# Patient Record
Sex: Female | Born: 1940 | Race: Black or African American | Hispanic: No | Smoking: Never smoker
Health system: Southern US, Community
[De-identification: ages and names within clinical notes are randomized; demographics above are authoritative.]

## PROBLEM LIST (undated history)

## (undated) DIAGNOSIS — M1990 Unspecified osteoarthritis, unspecified site: Secondary | ICD-10-CM

## (undated) DIAGNOSIS — I10 Essential (primary) hypertension: Secondary | ICD-10-CM

## (undated) DIAGNOSIS — E119 Type 2 diabetes mellitus without complications: Secondary | ICD-10-CM

## (undated) DIAGNOSIS — E785 Hyperlipidemia, unspecified: Secondary | ICD-10-CM

## (undated) DIAGNOSIS — I509 Heart failure, unspecified: Secondary | ICD-10-CM

---

## 2004-04-27 ENCOUNTER — Emergency Department: Payer: Self-pay | Admitting: Internal Medicine

## 2007-08-18 ENCOUNTER — Emergency Department: Payer: Self-pay | Admitting: Emergency Medicine

## 2011-09-20 ENCOUNTER — Emergency Department: Payer: Self-pay | Admitting: Emergency Medicine

## 2011-09-28 ENCOUNTER — Emergency Department: Payer: Self-pay | Admitting: *Deleted

## 2014-12-30 ENCOUNTER — Encounter: Payer: Self-pay | Admitting: Emergency Medicine

## 2014-12-30 ENCOUNTER — Emergency Department: Payer: No Typology Code available for payment source

## 2014-12-30 ENCOUNTER — Emergency Department
Admission: EM | Admit: 2014-12-30 | Discharge: 2014-12-30 | Disposition: A | Discharge status: Home / Self Care | Payer: No Typology Code available for payment source | Attending: Student | Admitting: Student

## 2014-12-30 DIAGNOSIS — Y92410 Unspecified street and highway as the place of occurrence of the external cause: Secondary | ICD-10-CM | POA: Diagnosis not present

## 2014-12-30 DIAGNOSIS — Y998 Other external cause status: Secondary | ICD-10-CM | POA: Diagnosis not present

## 2014-12-30 DIAGNOSIS — S20211A Contusion of right front wall of thorax, initial encounter: Secondary | ICD-10-CM | POA: Diagnosis not present

## 2014-12-30 DIAGNOSIS — S1081XA Abrasion of other specified part of neck, initial encounter: Secondary | ICD-10-CM | POA: Diagnosis not present

## 2014-12-30 DIAGNOSIS — E119 Type 2 diabetes mellitus without complications: Secondary | ICD-10-CM | POA: Insufficient documentation

## 2014-12-30 DIAGNOSIS — S299XXA Unspecified injury of thorax, initial encounter: Secondary | ICD-10-CM | POA: Diagnosis present

## 2014-12-30 DIAGNOSIS — Y9389 Activity, other specified: Secondary | ICD-10-CM | POA: Insufficient documentation

## 2014-12-30 DIAGNOSIS — I10 Essential (primary) hypertension: Secondary | ICD-10-CM | POA: Insufficient documentation

## 2014-12-30 DIAGNOSIS — S1091XA Abrasion of unspecified part of neck, initial encounter: Secondary | ICD-10-CM

## 2014-12-30 HISTORY — DX: Heart failure, unspecified: I50.9

## 2014-12-30 HISTORY — DX: Unspecified osteoarthritis, unspecified site: M19.90

## 2014-12-30 HISTORY — DX: Type 2 diabetes mellitus without complications: E11.9

## 2014-12-30 HISTORY — DX: Essential (primary) hypertension: I10

## 2014-12-30 HISTORY — DX: Hyperlipidemia, unspecified: E78.5

## 2014-12-30 MED ORDER — MORPHINE SULFATE (PF) 4 MG/ML IV SOLN
4.0000 mg | Freq: Once | INTRAVENOUS | Status: AC
  Administered 2014-12-30: 4 mg via INTRAVENOUS
  Filled 2014-12-30: qty 1

## 2014-12-30 MED ORDER — ONDANSETRON HCL 4 MG/2ML IJ SOLN
4.0000 mg | Freq: Once | INTRAMUSCULAR | Status: AC
  Administered 2014-12-30: 4 mg via INTRAVENOUS
  Filled 2014-12-30: qty 2

## 2014-12-30 MED ORDER — OXYCODONE HCL 5 MG PO TABS: 5.0000 mg | ORAL_TABLET | Freq: Four times a day (QID) | ORAL | Status: DC | PRN

## 2014-12-30 NOTE — ED Notes (Signed)
Pt given meal tray at this time, sitting up in bed eating, tolerating well, no acute distress noted.
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
## 2014-12-30 NOTE — ED Notes (Signed)
Patient transported to X-ray 

## 2014-12-30 NOTE — Discharge Instructions (Signed)
Blunt Chest Trauma °Blunt chest trauma is an injury caused by a blow to the chest. These chest injuries can be very painful. Blunt chest trauma often results in bruised or broken (fractured) ribs. Most cases of bruised and fractured ribs from blunt chest traumas get better after 1 to 3 weeks of rest and pain medicine. Often, the soft tissue in the chest wall is also injured, causing pain and bruising. Internal organs, such as the heart and lungs, may also be injured. Blunt chest trauma can lead to serious medical problems. This injury requires immediate medical care. °CAUSES  °· Motor vehicle collisions. °· Falls. °· Physical violence. °· Sports injuries. °SYMPTOMS  °· Chest pain. The pain may be worse when you move or breathe deeply. °· Shortness of breath. °· Lightheadedness. °· Bruising. °· Tenderness. °· Swelling. °DIAGNOSIS  °Your caregiver will do a physical exam. X-rays may be taken to look for fractures. However, minor rib fractures may not show up on X-rays until a few days after the injury. If a more serious injury is suspected, further imaging tests may be done. This may include ultrasounds, computed tomography (CT) scans, or magnetic resonance imaging (MRI). °TREATMENT  °Treatment depends on the severity of your injury. Your caregiver may prescribe pain medicines and deep breathing exercises. °HOME CARE INSTRUCTIONS °· Limit your activities until you can move around without much pain. °· Do not do any strenuous work until your injury is healed. °· Put ice on the injured area. °¨ Put ice in a plastic bag. °¨ Place a towel between your skin and the bag. °¨ Leave the ice on for 15-20 minutes, 03-04 times a day. °· You may wear a rib belt as directed by your caregiver to reduce pain. °· Practice deep breathing as directed by your caregiver to keep your lungs clear. °· Only take over-the-counter or prescription medicines for pain, fever, or discomfort as directed by your caregiver. °SEEK IMMEDIATE MEDICAL  CARE IF:  °· You have increasing pain or shortness of breath. °· You cough up blood. °· You have nausea, vomiting, or abdominal pain. °· You have a fever. °· You feel dizzy, weak, or you faint. °MAKE SURE YOU: °· Understand these instructions. °· Will watch your condition. °· Will get help right away if you are not doing well or get worse. °  °This information is not intended to replace advice given to you by your health care provider. Make sure you discuss any questions you have with your health care provider. °  °Document Released: 03/27/2004 Document Revised: 03/10/2014 Document Reviewed: 08/16/2014 °Elsevier Interactive Patient Education ©2016 [employer] ° °

## 2014-12-30 NOTE — ED Notes (Signed)
MD Gayle at bedside. 

## 2015-01-03 ENCOUNTER — Emergency Department
Admission: EM | Admit: 2015-01-03 | Discharge: 2015-01-03 | Disposition: A | Discharge status: Home / Self Care | Payer: Medicare Other | Attending: Emergency Medicine | Admitting: Emergency Medicine

## 2015-01-03 DIAGNOSIS — Z880 Allergy status to penicillin: Secondary | ICD-10-CM | POA: Diagnosis not present

## 2015-01-03 DIAGNOSIS — E119 Type 2 diabetes mellitus without complications: Secondary | ICD-10-CM | POA: Diagnosis not present

## 2015-01-03 DIAGNOSIS — S161XXD Strain of muscle, fascia and tendon at neck level, subsequent encounter: Secondary | ICD-10-CM | POA: Diagnosis not present

## 2015-01-03 DIAGNOSIS — G44209 Tension-type headache, unspecified, not intractable: Secondary | ICD-10-CM | POA: Diagnosis not present

## 2015-01-03 DIAGNOSIS — I10 Essential (primary) hypertension: Secondary | ICD-10-CM | POA: Diagnosis not present

## 2015-01-03 DIAGNOSIS — G44309 Post-traumatic headache, unspecified, not intractable: Secondary | ICD-10-CM | POA: Diagnosis present

## 2015-01-03 MED ORDER — TRAMADOL HCL 50 MG PO TABS
50.0000 mg | ORAL_TABLET | Freq: Once | ORAL | Status: AC
  Administered 2015-01-03: 50 mg via ORAL
  Filled 2015-01-03: qty 1

## 2015-01-03 MED ORDER — DIAZEPAM 2 MG PO TABS
2.0000 mg | ORAL_TABLET | Freq: Once | ORAL | Status: AC
  Administered 2015-01-03: 2 mg via ORAL
  Filled 2015-01-03: qty 1

## 2015-01-03 MED ORDER — TRAMADOL HCL 50 MG PO TABS: 50.0000 mg | ORAL_TABLET | Freq: Four times a day (QID) | ORAL | Status: DC | PRN

## 2015-01-03 MED ORDER — MELOXICAM 7.5 MG PO TABS: 7.5000 mg | ORAL_TABLET | Freq: Every day | ORAL | Status: AC
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
## 2015-01-03 NOTE — Discharge Instructions (Signed)
Tension Headache A tension headache is pain, pressure, or aching that is felt over the front and sides of your head. These headaches can last from 30 minutes to several days. HOME CARE Managing Pain  Take over-the-counter and prescription medicines only as told by your doctor.  Lie down in a dark, quiet room when you have a headache.  If directed, apply ice to your head and neck area:  Put ice in a plastic bag.  Place a towel between your skin and the bag.  Leave the ice on for 20 minutes, 2-3 times per day.  Use a heating pad or a hot shower to apply heat to your head and neck area as told by your doctor. Eating and Drinking  Eat meals on a regular schedule.  Do not drink a lot of alcohol.  Do not use a lot of caffeine, or stop using caffeine. General Instructions  Keep all follow-up visits as told by your doctor. This is important.  Keep a journal to find out if certain things bring on headaches. For example, write down:  What you eat and drink.  How much sleep you get.  Any change to your diet or medicines.  Try getting a massage, or doing other things that help you to relax.  Lessen stress.  Sit up straight. Do not tighten (tense) your muscles.  Do not use tobacco products. This includes cigarettes, chewing tobacco, or e-cigarettes. If you need help quitting, ask your doctor.  Exercise regularly as told by your doctor.  Get enough sleep. This may mean 7-9 hours of sleep. GET HELP IF:  Your symptoms are not helped by medicine.  You have a headache that feels different from your usual headache.  You feel sick to your stomach (nauseous) or you throw up (vomit).  You have a fever. GET HELP RIGHT AWAY IF:  Your headache becomes very bad.  You keep throwing up.  You have a stiff neck.  You have trouble seeing.  You have trouble speaking.  You have pain in your eye or ear.  Your muscles are weak or you lose muscle control.  You lose your balance  or you have trouble walking.  You feel like you will pass out (faint) or you pass out.  You have confusion.   This information is not intended to replace advice given to you by your health care provider. Make sure you discuss any questions you have with your health care provider.   Document Released: 05/14/2009 Document Revised: 11/08/2014 Document Reviewed: 06/12/2014 Elsevier Interactive Patient Education 2016 [place] with your doctor if any continued problems. Use moist compresses heat or ice to her neck muscles. Take tramadol as needed for headache. Begin meloxicam 1 daily with food as needed for your arthritis The aware that the pain medication increases drowsiness which may increase her risk of falling.
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
## 2017-06-06 ENCOUNTER — Other Ambulatory Visit: Payer: Self-pay

## 2017-06-06 ENCOUNTER — Emergency Department: Payer: Medicare Other

## 2017-06-06 ENCOUNTER — Encounter: Payer: Self-pay | Admitting: Emergency Medicine

## 2017-06-06 ENCOUNTER — Emergency Department
Admission: EM | Admit: 2017-06-06 | Discharge: 2017-06-06 | Disposition: A | Discharge status: Home / Self Care | Payer: Medicare Other | Attending: Emergency Medicine | Admitting: Emergency Medicine

## 2017-06-06 DIAGNOSIS — I509 Heart failure, unspecified: Secondary | ICD-10-CM | POA: Insufficient documentation

## 2017-06-06 DIAGNOSIS — M542 Cervicalgia: Secondary | ICD-10-CM | POA: Diagnosis not present

## 2017-06-06 DIAGNOSIS — M545 Low back pain, unspecified: Secondary | ICD-10-CM

## 2017-06-06 DIAGNOSIS — E119 Type 2 diabetes mellitus without complications: Secondary | ICD-10-CM | POA: Diagnosis not present

## 2017-06-06 DIAGNOSIS — I110 Hypertensive heart disease with heart failure: Secondary | ICD-10-CM | POA: Diagnosis not present

## 2017-06-06 MED ORDER — ACETAMINOPHEN 325 MG PO TABS
650.0000 mg | ORAL_TABLET | Freq: Once | ORAL | Status: AC
  Administered 2017-06-06: 650 mg via ORAL
  Filled 2017-06-06: qty 2

## 2017-06-06 MED ORDER — TRAMADOL HCL 50 MG PO TABS: 50.0000 mg | ORAL_TABLET | Freq: Two times a day (BID) | ORAL | 0 refills | Status: DC

## 2017-06-06 NOTE — ED Notes (Signed)
Patient transported to XR. 

## 2017-06-06 NOTE — ED Notes (Signed)
[employer] officer at bedside 
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
## 2019-04-30 ENCOUNTER — Encounter: Payer: Self-pay | Admitting: Emergency Medicine

## 2019-04-30 ENCOUNTER — Emergency Department: Payer: Medicare Other

## 2019-04-30 ENCOUNTER — Encounter
Admission: EM | Disposition: A | Discharge status: Home / Self Care | Payer: Self-pay | Source: Home / Self Care | Attending: Surgery

## 2019-04-30 ENCOUNTER — Ambulatory Visit: Admit: 2019-04-30 | Payer: No Typology Code available for payment source | Admitting: Surgery

## 2019-04-30 ENCOUNTER — Other Ambulatory Visit: Payer: Self-pay

## 2019-04-30 ENCOUNTER — Inpatient Hospital Stay
Admission: EM | Admit: 2019-04-30 | Discharge: 2019-05-04 | DRG: 329 | Disposition: A | Discharge status: Home Health | Payer: Medicare Other | Attending: Surgery | Admitting: Surgery

## 2019-04-30 ENCOUNTER — Emergency Department: Payer: Medicare Other | Admitting: Certified Registered"

## 2019-04-30 DIAGNOSIS — Z79899 Other long term (current) drug therapy: Secondary | ICD-10-CM

## 2019-04-30 DIAGNOSIS — Z20822 Contact with and (suspected) exposure to covid-19: Secondary | ICD-10-CM | POA: Diagnosis present

## 2019-04-30 DIAGNOSIS — K460 Unspecified abdominal hernia with obstruction, without gangrene: Secondary | ICD-10-CM | POA: Diagnosis not present

## 2019-04-30 DIAGNOSIS — I509 Heart failure, unspecified: Secondary | ICD-10-CM | POA: Diagnosis present

## 2019-04-30 DIAGNOSIS — E119 Type 2 diabetes mellitus without complications: Secondary | ICD-10-CM | POA: Diagnosis present

## 2019-04-30 DIAGNOSIS — K436 Other and unspecified ventral hernia with obstruction, without gangrene: Principal | ICD-10-CM | POA: Diagnosis present

## 2019-04-30 DIAGNOSIS — E876 Hypokalemia: Secondary | ICD-10-CM | POA: Diagnosis present

## 2019-04-30 DIAGNOSIS — K559 Vascular disorder of intestine, unspecified: Secondary | ICD-10-CM | POA: Diagnosis present

## 2019-04-30 DIAGNOSIS — K55029 Acute infarction of small intestine, extent unspecified: Secondary | ICD-10-CM | POA: Diagnosis present

## 2019-04-30 DIAGNOSIS — E785 Hyperlipidemia, unspecified: Secondary | ICD-10-CM | POA: Diagnosis present

## 2019-04-30 DIAGNOSIS — R112 Nausea with vomiting, unspecified: Secondary | ICD-10-CM

## 2019-04-30 DIAGNOSIS — Z7982 Long term (current) use of aspirin: Secondary | ICD-10-CM

## 2019-04-30 DIAGNOSIS — M1990 Unspecified osteoarthritis, unspecified site: Secondary | ICD-10-CM | POA: Diagnosis present

## 2019-04-30 DIAGNOSIS — K567 Ileus, unspecified: Secondary | ICD-10-CM | POA: Diagnosis not present

## 2019-04-30 DIAGNOSIS — I110 Hypertensive heart disease with heart failure: Secondary | ICD-10-CM | POA: Diagnosis present

## 2019-04-30 DIAGNOSIS — Z79891 Long term (current) use of opiate analgesic: Secondary | ICD-10-CM | POA: Diagnosis not present

## 2019-04-30 DIAGNOSIS — K56609 Unspecified intestinal obstruction, unspecified as to partial versus complete obstruction: Secondary | ICD-10-CM

## 2019-04-30 HISTORY — PX: VENTRAL HERNIA REPAIR: SHX424

## 2019-04-30 LAB — COMPREHENSIVE METABOLIC PANEL
ALT: 10 U/L (ref 0–44)
AST: 18 U/L (ref 15–41)
Albumin: 3.5 g/dL (ref 3.5–5.0)
Alkaline Phosphatase: 69 U/L (ref 38–126)
Anion gap: 12 (ref 5–15)
BUN: 20 mg/dL (ref 8–23)
CO2: 23 mmol/L (ref 22–32)
Calcium: 9.4 mg/dL (ref 8.9–10.3)
Chloride: 99 mmol/L (ref 98–111)
Creatinine, Ser: 1.08 mg/dL — ABNORMAL HIGH (ref 0.44–1.00)
GFR calc Af Amer: 57 mL/min — ABNORMAL LOW (ref >60)
GFR calc non Af Amer: 49 mL/min — ABNORMAL LOW (ref >60)
Glucose, Bld: 290 mg/dL — ABNORMAL HIGH (ref 70–99)
Potassium: 3.1 mmol/L — ABNORMAL LOW (ref 3.5–5.1)
Sodium: 134 mmol/L — ABNORMAL LOW (ref 135–145)
Total Bilirubin: 1.5 mg/dL — ABNORMAL HIGH (ref 0.3–1.2)
Total Protein: 8.4 g/dL — ABNORMAL HIGH (ref 6.5–8.1)

## 2019-04-30 LAB — CBC
HCT: 41.2 % (ref 36.0–46.0)
Hemoglobin: 13.5 g/dL (ref 12.0–15.0)
MCH: 29.3 pg (ref 26.0–34.0)
MCHC: 32.8 g/dL (ref 30.0–36.0)
MCV: 89.6 fL (ref 80.0–100.0)
Platelets: 462 10*3/uL — ABNORMAL HIGH (ref 150–400)
RBC: 4.6 MIL/uL (ref 3.87–5.11)
RDW: 13.8 % (ref 11.5–15.5)
WBC: 11.7 10*3/uL — ABNORMAL HIGH (ref 4.0–10.5)
nRBC: 0 % (ref 0.0–0.2)

## 2019-04-30 LAB — RESPIRATORY PANEL BY RT PCR (FLU A&B, COVID)
Influenza A by PCR: NEGATIVE
Influenza B by PCR: NEGATIVE
SARS Coronavirus 2 by RT PCR: NEGATIVE

## 2019-04-30 LAB — LIPASE, BLOOD: Lipase: 36 U/L (ref 11–51)

## 2019-04-30 LAB — GLUCOSE, CAPILLARY: Glucose-Capillary: 171 mg/dL — ABNORMAL HIGH (ref 70–99)

## 2019-04-30 SURGERY — REPAIR, HERNIA, VENTRAL
Anesthesia: General | Site: Abdomen

## 2019-04-30 MED ORDER — INSULIN ASPART 100 UNIT/ML ~~LOC~~ SOLN: 0.0000 [IU] | Freq: Every day | SUBCUTANEOUS | Status: DC

## 2019-04-30 MED ORDER — MORPHINE SULFATE (PF) 4 MG/ML IV SOLN
4.0000 mg | Freq: Once | INTRAVENOUS | Status: AC
  Administered 2019-04-30: 4 mg via INTRAVENOUS
  Filled 2019-04-30: qty 1

## 2019-04-30 MED ORDER — ENOXAPARIN SODIUM 40 MG/0.4ML ~~LOC~~ SOLN
40.0000 mg | SUBCUTANEOUS | Status: DC
  Administered 2019-05-02 – 2019-05-04 (×3): 40 mg via SUBCUTANEOUS
  Filled 2019-04-30 (×4): qty 0.4

## 2019-04-30 MED ORDER — OXYCODONE HCL 5 MG PO TABS
5.0000 mg | ORAL_TABLET | ORAL | Status: DC | PRN
  Administered 2019-05-03: 5 mg via ORAL
  Filled 2019-04-30: qty 1

## 2019-04-30 MED ORDER — MORPHINE SULFATE (PF) 2 MG/ML IV SOLN
2.0000 mg | INTRAVENOUS | Status: DC | PRN
  Administered 2019-05-01: 2 mg via INTRAVENOUS
  Filled 2019-04-30: qty 1

## 2019-04-30 MED ORDER — ONDANSETRON HCL 4 MG/2ML IJ SOLN
INTRAMUSCULAR | Status: DC | PRN
  Administered 2019-04-30: 4 mg via INTRAVENOUS

## 2019-04-30 MED ORDER — ONDANSETRON HCL 4 MG/2ML IJ SOLN
4.0000 mg | Freq: Four times a day (QID) | INTRAMUSCULAR | Status: DC | PRN
  Administered 2019-05-01 – 2019-05-02 (×4): 4 mg via INTRAVENOUS
  Filled 2019-04-30 (×4): qty 2

## 2019-04-30 MED ORDER — PROCHLORPERAZINE MALEATE 10 MG PO TABS
10.0000 mg | ORAL_TABLET | Freq: Four times a day (QID) | ORAL | Status: DC | PRN
  Filled 2019-04-30: qty 1

## 2019-04-30 MED ORDER — SUGAMMADEX SODIUM 200 MG/2ML IV SOLN
INTRAVENOUS | Status: DC | PRN
  Administered 2019-04-30: 150 mg via INTRAVENOUS

## 2019-04-30 MED ORDER — FENTANYL CITRATE (PF) 100 MCG/2ML IJ SOLN: 25.0000 ug | INTRAMUSCULAR | Status: DC | PRN

## 2019-04-30 MED ORDER — ACETAMINOPHEN 500 MG PO TABS
1000.0000 mg | ORAL_TABLET | Freq: Four times a day (QID) | ORAL | Status: DC
  Administered 2019-05-01 – 2019-05-03 (×7): 1000 mg via ORAL
  Administered 2019-05-04: 500 mg via ORAL
  Filled 2019-04-30 (×9): qty 2

## 2019-04-30 MED ORDER — BUPIVACAINE LIPOSOME 1.3 % IJ SUSP
INTRAMUSCULAR | Status: DC | PRN
  Administered 2019-04-30: 20 mL

## 2019-04-30 MED ORDER — PANTOPRAZOLE SODIUM 40 MG IV SOLR
40.0000 mg | Freq: Every day | INTRAVENOUS | Status: DC
  Administered 2019-04-30 – 2019-05-03 (×4): 40 mg via INTRAVENOUS
  Filled 2019-04-30 (×4): qty 40

## 2019-04-30 MED ORDER — DEXAMETHASONE SODIUM PHOSPHATE 10 MG/ML IJ SOLN
INTRAMUSCULAR | Status: DC | PRN
  Administered 2019-04-30: 10 mg via INTRAVENOUS

## 2019-04-30 MED ORDER — SODIUM CHLORIDE 0.9 % IV BOLUS: 1000.0000 mL | Freq: Once | INTRAVENOUS | Status: DC

## 2019-04-30 MED ORDER — SODIUM CHLORIDE 0.9% FLUSH: 3.0000 mL | Freq: Once | INTRAVENOUS | Status: DC

## 2019-04-30 MED ORDER — SODIUM CHLORIDE 0.9 % IV SOLN
2.0000 g | Freq: Two times a day (BID) | INTRAVENOUS | Status: AC
  Administered 2019-04-30 – 2019-05-01 (×2): 2 g via INTRAVENOUS
  Filled 2019-04-30 (×2): qty 2

## 2019-04-30 MED ORDER — DIPHENHYDRAMINE HCL 50 MG/ML IJ SOLN: 12.5000 mg | Freq: Four times a day (QID) | INTRAMUSCULAR | Status: DC | PRN

## 2019-04-30 MED ORDER — BUPIVACAINE LIPOSOME 1.3 % IJ SUSP
INTRAMUSCULAR | Status: AC
  Filled 2019-04-30: qty 20

## 2019-04-30 MED ORDER — LACTATED RINGERS IV SOLN: INTRAVENOUS | Status: DC | PRN

## 2019-04-30 MED ORDER — PROPOFOL 10 MG/ML IV BOLUS
INTRAVENOUS | Status: DC | PRN
  Administered 2019-04-30: 90 mg via INTRAVENOUS

## 2019-04-30 MED ORDER — FENTANYL CITRATE (PF) 100 MCG/2ML IJ SOLN
INTRAMUSCULAR | Status: DC | PRN
  Administered 2019-04-30: 25 ug via INTRAVENOUS
  Administered 2019-04-30: 50 ug via INTRAVENOUS

## 2019-04-30 MED ORDER — ROCURONIUM BROMIDE 100 MG/10ML IV SOLN
INTRAVENOUS | Status: DC | PRN
  Administered 2019-04-30: 5 mg via INTRAVENOUS
  Administered 2019-04-30: 25 mg via INTRAVENOUS
  Administered 2019-04-30: 20 mg via INTRAVENOUS

## 2019-04-30 MED ORDER — INSULIN ASPART 100 UNIT/ML ~~LOC~~ SOLN
4.0000 [IU] | Freq: Three times a day (TID) | SUBCUTANEOUS | Status: DC
  Administered 2019-05-03 – 2019-05-04 (×2): 4 [IU] via SUBCUTANEOUS
  Filled 2019-04-30 (×4): qty 1

## 2019-04-30 MED ORDER — FLUTICASONE PROPIONATE 50 MCG/ACT NA SUSP
1.0000 | Freq: Every day | NASAL | Status: DC
  Administered 2019-05-01 – 2019-05-04 (×3): 1 via NASAL
  Filled 2019-04-30: qty 16

## 2019-04-30 MED ORDER — SODIUM CHLORIDE 0.9 % IV SOLN
2.0000 g | Freq: Two times a day (BID) | INTRAVENOUS | Status: DC
  Administered 2019-04-30: 18:00:00 2 g via INTRAVENOUS
  Filled 2019-04-30 (×3): qty 2

## 2019-04-30 MED ORDER — PROCHLORPERAZINE EDISYLATE 10 MG/2ML IJ SOLN
5.0000 mg | Freq: Four times a day (QID) | INTRAMUSCULAR | Status: DC | PRN
  Administered 2019-05-01 – 2019-05-02 (×2): 5 mg via INTRAVENOUS
  Filled 2019-04-30 (×2): qty 2

## 2019-04-30 MED ORDER — ONDANSETRON HCL 4 MG/2ML IJ SOLN: 4.0000 mg | Freq: Once | INTRAMUSCULAR | Status: DC | PRN

## 2019-04-30 MED ORDER — ONDANSETRON 4 MG PO TBDP: 4.0000 mg | ORAL_TABLET | Freq: Four times a day (QID) | ORAL | Status: DC | PRN

## 2019-04-30 MED ORDER — LIDOCAINE HCL (CARDIAC) PF 100 MG/5ML IV SOSY
PREFILLED_SYRINGE | INTRAVENOUS | Status: DC | PRN
  Administered 2019-04-30: 60 mg via INTRAVENOUS

## 2019-04-30 MED ORDER — KETOROLAC TROMETHAMINE 30 MG/ML IJ SOLN
INTRAMUSCULAR | Status: DC | PRN
  Administered 2019-04-30: 30 mg via INTRAVENOUS

## 2019-04-30 MED ORDER — ONDANSETRON HCL 4 MG/2ML IJ SOLN
4.0000 mg | Freq: Once | INTRAMUSCULAR | Status: AC
Start: 2019-04-30 — End: 2019-04-30
  Administered 2019-04-30: 4 mg via INTRAVENOUS
  Filled 2019-04-30: qty 2

## 2019-04-30 MED ORDER — METOPROLOL TARTRATE 50 MG PO TABS
50.0000 mg | ORAL_TABLET | Freq: Two times a day (BID) | ORAL | Status: DC
  Administered 2019-04-30 – 2019-05-04 (×7): 50 mg via ORAL
  Filled 2019-04-30 (×7): qty 1

## 2019-04-30 MED ORDER — PHENYLEPHRINE HCL (PRESSORS) 10 MG/ML IV SOLN
INTRAVENOUS | Status: DC | PRN
  Administered 2019-04-30: 200 ug via INTRAVENOUS
  Administered 2019-04-30 (×2): 100 ug via INTRAVENOUS

## 2019-04-30 MED ORDER — INSULIN ASPART 100 UNIT/ML ~~LOC~~ SOLN
0.0000 [IU] | Freq: Three times a day (TID) | SUBCUTANEOUS | Status: DC
  Administered 2019-05-03 – 2019-05-04 (×2): 3 [IU] via SUBCUTANEOUS
  Filled 2019-04-30 (×3): qty 1

## 2019-04-30 MED ORDER — DIPHENHYDRAMINE HCL 12.5 MG/5ML PO ELIX
12.5000 mg | ORAL_SOLUTION | Freq: Four times a day (QID) | ORAL | Status: DC | PRN
  Filled 2019-04-30: qty 5

## 2019-04-30 MED ORDER — HYDRALAZINE HCL 20 MG/ML IJ SOLN: 10.0000 mg | INTRAMUSCULAR | Status: DC | PRN

## 2019-04-30 MED ORDER — IOHEXOL 300 MG/ML  SOLN
100.0000 mL | Freq: Once | INTRAMUSCULAR | Status: AC | PRN
  Administered 2019-04-30: 100 mL via INTRAVENOUS

## 2019-04-30 MED ORDER — KETOROLAC TROMETHAMINE 15 MG/ML IJ SOLN
15.0000 mg | Freq: Four times a day (QID) | INTRAMUSCULAR | Status: DC | PRN
  Administered 2019-05-01 – 2019-05-02 (×2): 15 mg via INTRAVENOUS
  Filled 2019-04-30 (×2): qty 1

## 2019-04-30 MED ORDER — BUPIVACAINE-EPINEPHRINE (PF) 0.25% -1:200000 IJ SOLN
INTRAMUSCULAR | Status: DC | PRN
  Administered 2019-04-30: 30 mL via PERINEURAL

## 2019-04-30 MED ORDER — SODIUM CHLORIDE 0.9 % IV BOLUS
1000.0000 mL | Freq: Once | INTRAVENOUS | Status: AC
  Administered 2019-04-30: 15:00:00 1000 mL via INTRAVENOUS

## 2019-04-30 MED ORDER — SUCCINYLCHOLINE CHLORIDE 20 MG/ML IJ SOLN
INTRAMUSCULAR | Status: DC | PRN
  Administered 2019-04-30: 100 mg via INTRAVENOUS

## 2019-04-30 MED ORDER — SODIUM CHLORIDE 0.9 % IV SOLN: INTRAVENOUS | Status: DC

## 2019-04-30 SURGICAL SUPPLY — 32 items
APPLIER CLIP 11 MED OPEN (CLIP)
APPLIER CLIP 13 LRG OPEN (CLIP)
BLADE CLIPPER SURG (BLADE) ×3 IMPLANT
CANISTER SUCT 3000ML PPV (MISCELLANEOUS) ×3 IMPLANT
CHLORAPREP W/TINT 26 (MISCELLANEOUS) ×3 IMPLANT
CLIP APPLIE 11 MED OPEN (CLIP) IMPLANT
CLIP APPLIE 13 LRG OPEN (CLIP) IMPLANT
COVER WAND RF STERILE (DRAPES) ×3 IMPLANT
DRAPE LAPAROTOMY 100X77 ABD (DRAPES) ×3 IMPLANT
DRSG TELFA 3X8 NADH (GAUZE/BANDAGES/DRESSINGS) ×3 IMPLANT
ELECT CAUTERY BLADE 6.4 (BLADE) ×3 IMPLANT
ELECT REM PT RETURN 9FT ADLT (ELECTROSURGICAL) ×3
ELECTRODE REM PT RTRN 9FT ADLT (ELECTROSURGICAL) ×1 IMPLANT
GAUZE SPONGE 4X4 12PLY STRL (GAUZE/BANDAGES/DRESSINGS) ×3 IMPLANT
GLOVE BIO SURGEON STRL SZ7 (GLOVE) ×3 IMPLANT
GOWN STRL REUS W/ TWL LRG LVL3 (GOWN DISPOSABLE) ×2 IMPLANT
GOWN STRL REUS W/TWL LRG LVL3 (GOWN DISPOSABLE) ×4
HANDLE SUCTION POOLE (INSTRUMENTS) ×1 IMPLANT
NEEDLE HYPO 22GX1.5 SAFETY (NEEDLE) ×3 IMPLANT
NEEDLE HYPO 25X1 1.5 SAFETY (NEEDLE) ×3 IMPLANT
PACK BASIN MINOR ARMC (MISCELLANEOUS) ×3 IMPLANT
RELOAD PROXIMATE 75MM BLUE (ENDOMECHANICALS) ×9 IMPLANT
SPONGE LAP 18X18 RF (DISPOSABLE) ×3 IMPLANT
STAPLER PROXIMATE 75MM BLUE (STAPLE) ×3 IMPLANT
STAPLER SKIN PROX 35W (STAPLE) ×3 IMPLANT
SUCTION POOLE HANDLE (INSTRUMENTS) ×3
SUT ETHIBOND 0 MO6 C/R (SUTURE) ×3 IMPLANT
SUT VIC AB 2-0 SH 27 (SUTURE) ×4
SUT VIC AB 2-0 SH 27XBRD (SUTURE) ×2 IMPLANT
SYR 20ML LL LF (SYRINGE) ×3 IMPLANT
TAPE MICROFOAM 4IN (TAPE) ×3 IMPLANT
WATER STERILE IRR 1000ML POUR (IV SOLUTION) ×3 IMPLANT

## 2019-04-30 NOTE — ED Notes (Signed)
Surgeon at bedside.  

## 2019-04-30 NOTE — Op Note (Signed)
Ventral Hernia Repair with small bowel resection  Pre-operative Diagnosis: strangulated hernia causing SBO  Post-operative Diagnosis: Same  Surgeon: Caroleen Hamman, MD FACS  Anesthesia: Gen. with endotracheal tube  Findings: Strangulated hernia causing a short segment of bowel ischemia/partial necrosis   Estimated Blood Loss: 10cc         Drains: none         Specimens: hernia sac and small bowel       Complications: none              Procedure Details  The patient was seen again in the Holding Room. The benefits, complications, treatment options, and expected outcomes were discussed with the patient. The risks of bleeding, infection, recurrence of symptoms, failure to resolve symptoms, bowel injury, mesh placement, mesh infection, any of which could require further surgery were reviewed with the patient. The likelihood of improving the patient's symptoms with return to their baseline status is good.  The patient and/or family concurred with the proposed plan, giving informed consent.  The patient was taken to Operating Room, identified as Brenda Bender and the procedure verified.  A Time Out was held and the above information confirmed.  Prior to the induction of general anesthesia, antibiotic prophylaxis was administered. VTE prophylaxis was in place. General endotracheal anesthesia was then administered and tolerated well. After the induction, the abdomen was prepped with Chloraprep and draped in the sterile fashion. The patient was positioned in the supine position.   Incision was created with a scalpel over the hernia defect. Electrocautery was used to dissect through subcutaneous tissue, the hernia sac was opened and rises of adhesion was performed with Metzenbaum's scissors. Hernia was reduced after incising the fascia to allow reduction of small bowel. Hernia sac was excised.  I found the small bowel to be ischemic with early necrosis on the antimesenteric border. I decided to  do a bowel resection  After creating mesenteric windows proximally and distally. The bowel was resected with a 63mm GIA stapler and side to side functional end to end anastomosis created with the same stapler device. Anastomosis was widely patent under no tension and with good perfusion. Mesenteric defect closed w a running 3-0 vicryl.  I closed the hernia defect with interrupted 0 Ethibond sutures. Defect measured about 6  Cms including extension made on the fascia to release bowel. Liposomal marcaine injected full thickness abdominal wall.  Patient tolerated procedure well and there were no immediate complications. Needle and laparotomy counts were correct   Caroleen Hamman, MD, FACS

## 2019-04-30 NOTE — Progress Notes (Signed)
Spoke with patient's daughters, [name] and Joseph Art who are aware pt to room 208. Pt spoke with both daughters via phone conversation.

## 2019-04-30 NOTE — Anesthesia Preprocedure Evaluation (Addendum)
Anesthesia Evaluation  Patient identified by MRN, date of birth, ID band Patient awake    Reviewed: Allergy & Precautions, NPO status , Patient's Chart, lab work & pertinent test results  Airway Mallampati: III  TM Distance: >3 FB     Dental  (+) Upper Dentures, Lower Dentures   Pulmonary neg pulmonary ROS,    Pulmonary exam normal        Cardiovascular hypertension, +CHF  Normal cardiovascular exam     Neuro/Psych negative neurological ROS  negative psych ROS   GI/Hepatic Neg liver ROS,   Endo/Other  diabetes  Renal/GU negative Renal ROS  negative genitourinary   Musculoskeletal  (+) Arthritis , Osteoarthritis,    Abdominal Normal abdominal exam  (+)   Peds negative pediatric ROS (+)  Hematology negative hematology ROS (+)   Anesthesia Other Findings   Reproductive/Obstetrics                            Anesthesia Physical Anesthesia Plan  ASA: III and emergent  Anesthesia Plan: General   Post-op Pain Management:    Induction: Intravenous, Rapid sequence and Cricoid pressure planned  PONV Risk Score and Plan:   Airway Management Planned: Oral ETT  Additional Equipment:   Intra-op Plan:   Post-operative Plan: Extubation in OR  Informed Consent: I have reviewed the patients History and Physical, chart, labs and discussed the procedure including the risks, benefits and alternatives for the proposed anesthesia with the patient or authorized representative who has indicated his/her understanding and acceptance.     Dental advisory given  Plan Discussed with: CRNA and Surgeon  Anesthesia Plan Comments:         Anesthesia Quick Evaluation

## 2019-04-30 NOTE — ED Notes (Signed)
Pt taken to CT.

## 2019-04-30 NOTE — Anesthesia Procedure Notes (Signed)
Procedure Name: Intubation Date/Time: 04/30/2019 6:37 PM Performed by: Esaw Grandchild, CRNA Pre-anesthesia Checklist: Patient identified, Emergency Drugs available, Suction available and Patient being monitored Patient Re-evaluated:Patient Re-evaluated prior to induction Oxygen Delivery Method: Circle system utilized Preoxygenation: Pre-oxygenation with 100% oxygen Induction Type: IV induction, Rapid sequence and Cricoid Pressure applied Ventilation: Mask ventilation without difficulty Laryngoscope Size: Miller and 2 Grade View: Grade I Tube type: Oral Tube size: 7.0 mm Number of attempts: 1 Airway Equipment and Method: Stylet and Oral airway Placement Confirmation: ETT inserted through vocal cords under direct vision,  positive ETCO2 and breath sounds checked- equal and bilateral Secured at: 20 cm Tube secured with: Tape Dental Injury: Teeth and Oropharynx as per pre-operative assessment

## 2019-04-30 NOTE — ED Notes (Signed)
Called and spoke with daughter Joseph Art at [phone]. Updated on plan of care. Pt gave permission to speak to daughter.

## 2019-04-30 NOTE — Transfer of Care (Signed)
Immediate Anesthesia Transfer of Care Note  Patient: Brenda Bender  Procedure(s) Performed: HERNIA REPAIR VENTRAL ADULT (N/A Abdomen)  Patient Location: PACU  Anesthesia Type:General  Level of Consciousness: awake, alert  and oriented  Airway & Oxygen Therapy: Patient Spontanous Breathing and Patient connected to face mask oxygen  Post-op Assessment: Report given to RN and Post -op Vital signs reviewed and stable  Post vital signs: Reviewed and stable  Last Vitals:  Vitals Value Taken Time  BP 132/90 04/30/19 1948  Temp 36.4 C 04/30/19 1948  Pulse 88 04/30/19 1952  Resp 13 04/30/19 1952  SpO2 100 % 04/30/19 1952  Vitals shown include unvalidated device data.  Last Pain:  Vitals:   04/30/19 1952  TempSrc:   PainSc: 0-No pain         Complications: No apparent anesthesia complications

## 2019-04-30 NOTE — ED Provider Notes (Signed)
[facility] Emergency Department Provider Note  Time seen: 1:21 PM  I have reviewed the triage vital signs and the nursing notes.   HISTORY  Chief Complaint Emesis and Constipation   HPI Brenda Bender is a 79 y.o. female with a past medical history of arthritis, CHF, diabetes, hypertension, hyperlipidemia presents to the emergency department for abdominal pain nausea vomiting.  According to the patient over the past week or 2 she has been experiencing intermittent nausea and vomiting but over the past 3 to 4 days she has been experiencing abdominal pain and the vomiting has been fairly constant.  Patient describes as green/yellow bilious looking vomit.  States she was having loose stool has not had a bowel movement approximately 4 days.  Patient has a what appears to be a ventral hernia that has become painful over the past 4 days.  Denies any fever.  Denies any cough congestion or shortness of breath.  Describes abdominal pain as moderate located in the mid abdomen dull aching type pain.   Past Medical History:  Diagnosis Date  . Arthritis   . CHF (congestive heart failure) ([place])   . Diabetes mellitus without complication ([place])   . HLD (hyperlipidemia)   . Hypertension     There are no problems to display for this patient.   History reviewed. No pertinent surgical history.  Prior to Admission medications   Medication Sig Start Date End Date Taking? Authorizing Provider  traMADol (ULTRAM) 50 MG tablet Take 1 tablet (50 mg total) by mouth 2 (two) times daily. 06/06/17   Menshew, Dannielle Karvonen, PA-C    Allergies  Allergen Reactions  . Penicillins Nausea And Vomiting    History reviewed. No pertinent family history.  Social History Social History   Tobacco Use  . Smoking status: Never Smoker  . Smokeless tobacco: Never Used  Substance Use Topics  . Alcohol use: No  . Drug use: No    Review of Systems Constitutional: Negative for  fever. Cardiovascular: Negative for chest pain. Respiratory: Negative for shortness of breath. Gastrointestinal: Positive for mid abdominal pain.  Positive for nausea vomiting.  Constipation x4 days. Genitourinary: Negative for urinary compaints Musculoskeletal: Negative for musculoskeletal complaints Neurological: Negative for headache All other ROS negative  ____________________________________________   PHYSICAL EXAM:  VITAL SIGNS: ED Triage Vitals  Enc Vitals Group     BP 04/30/19 1236 133/77     Pulse Rate 04/30/19 1236 (!) 125     Resp 04/30/19 1236 18     Temp 04/30/19 1236 98.7 F (37.1 C)     Temp Source 04/30/19 1236 Oral     SpO2 --      Weight 04/30/19 1237 167 lb (75.8 kg)     Height 04/30/19 1237 5\' 3"  (1.6 m)     Head Circumference --      Peak Flow --      Pain Score 04/30/19 1236 0     Pain Loc --      Pain Edu? --      Excl. in [place]? --    Constitutional: Alert and oriented. Well appearing and in no distress. Eyes: Normal exam ENT      Head: Normocephalic and atraumatic.      Mouth/Throat: Mucous membranes are moist. Cardiovascular: Normal rate, regular rhythm. Respiratory: Normal respiratory effort without tachypnea nor retractions. Breath sounds are clear  Gastrointestinal: Patient has moderate tenderness palpation over a palpable hernia to the anterior abdominal wall.  Abdomen  otherwise largely benign. Musculoskeletal: Nontender with normal range of motion in all extremities.  Neurologic:  Normal speech and language. No gross focal neurologic deficits  Skin:  Skin is warm, dry and intact.  Psychiatric: Mood and affect are normal.   ____________________________________________     RADIOLOGY  CT appears show an incarcerated hernia with small bowel obstruction.  ____________________________________________   INITIAL IMPRESSION / ASSESSMENT AND PLAN / ED COURSE  Pertinent labs & imaging results that were available during my care of the patient  were reviewed by me and considered in my medical decision making (see chart for details).   Patient presents emergency department for abdominal pain nausea vomiting and 4 days of constipation.  Patient has bilious vomiting.  Concern would be for bowel obstruction possibly due to incarcerated hernia.  We will obtain CT imaging the abdomen/pelvis, check labs, treat pain nausea and IV hydrate with awaiting results.  Patient agreeable to plan of care.  I personally reviewed the patient's CT imaging which appears to show a small bowel obstruction due to an incarcerated hernia.  Spoke with Dr. Dahlia Byes of general surgery who will be down to see the patient.  Brenda Bender was evaluated in Emergency Department on 04/30/2019 for the symptoms described in the history of present illness. She was evaluated in the context of the global COVID-19 pandemic, which necessitated consideration that the patient might be at risk for infection with the SARS-CoV-2 virus that causes COVID-19. Institutional protocols and algorithms that pertain to the evaluation of patients at risk for COVID-19 are in a state of rapid change based on information released by regulatory bodies including the CDC and federal and state organizations. These policies and algorithms were followed during the patient's care in the ED.  ____________________________________________   FINAL CLINICAL IMPRESSION(S) / ED DIAGNOSES  Abdominal pain Nausea vomiting Incarcerated hernia Small bowel obstruction   Harvest Dark, MD 04/30/19 1512

## 2019-04-30 NOTE — ED Triage Notes (Signed)
Pt to ER states constipation and vomiting x 1 week.  Pt states she is unable to eat anything due to vomiting.  Pt states last BM 3 days ago.

## 2019-04-30 NOTE — ED Notes (Signed)
ED Provider at bedside. 

## 2019-04-30 NOTE — H&P (Signed)
Patient ID: Brenda Bender, female   DOB: 07-20-1940, 79 y.o.   MRN: [id number]  HPI Brenda Bender is a 79 y.o. female with a 3-day history of abdominal pain.  The pain now is severe and persistent sharp located in the epigastric area.  She also reports nausea and vomiting.  No fevers no chills.  No prior abdominal operations. She is able to perform more than 4 METS of activity without any shortness of breath.  Her comorbidities including CHF diabetes and hypertension.   she is on a daily aspirin. CT scan personally reviewed showing a incarcerated ventral hernia causing a small bowel obstruction due to incarceration.  There is no free air there is some edema within the wall of the bowel. Laboratory data includes an increase in the white count of 11.7 and thrombocytosis.  She does have an acute mild acute kidney injury with a creatinine of 1.08.  She does have her glucose of 290.  HPI  Past Medical History:  Diagnosis Date  . Arthritis   . CHF (congestive heart failure) ([place])   . Diabetes mellitus without complication ([place])   . HLD (hyperlipidemia)   . Hypertension     History reviewed. No pertinent surgical history.  History reviewed. No pertinent family history.  Social History Social History   Tobacco Use  . Smoking status: Never Smoker  . Smokeless tobacco: Never Used  Substance Use Topics  . Alcohol use: No  . Drug use: No    Allergies  Allergen Reactions  . Penicillins Nausea And Vomiting    Current Facility-Administered Medications  Medication Dose Route Frequency Provider Last Rate Last Admin  . cefoTEtan (CEFOTAN) 2 g in sodium chloride 0.9 % 100 mL IVPB  2 g Intravenous Q12H Brieanna Nau F, MD      . sodium chloride 0.9 % bolus 1,000 mL  1,000 mL Intravenous Once Alvino Lechuga F, MD       Current Outpatient Medications  Medication Sig Dispense Refill  . traMADol (ULTRAM) 50 MG tablet Take 1 tablet (50 mg total) by mouth 2 (two) times daily. 10 tablet 0      Review of Systems Full ROS  was asked and was negative except for the information on the HPI  Physical Exam Blood pressure 133/77, pulse (!) 125, temperature 98.7 F (37.1 C), temperature source Oral, resp. rate 18, height 5\' 3"  (1.6 m), weight 75.8 kg. CONSTITUTIONAL: NAD EYES: Pupils are equal, round, and reactive to light, Sclera are non-icteric. EARS, NOSE, MOUTH AND THROAT: The oropharynx is clear. The oral mucosa is pink and moist. Hearing is intact to voice. LYMPH NODES:  Lymph nodes in the neck are normal. RESPIRATORY:  Lungs are clear. There is normal respiratory effort, with equal breath sounds bilaterally, and without pathologic use of accessory muscles. CARDIOVASCULAR: Heart is regular without murmurs, gallops, or rubs. GI: The abdomen is  soft, exquisite tenderness to palpation  Over epigastric area where the hernia is.  Now unable to reduce it.  There is some skin changes as well. There is no overt peritonitis however but focally only where the hernia is. GU: Rectal deferred.   MUSCULOSKELETAL: Normal muscle strength and tone. No cyanosis or edema.   SKIN: Turgor is good and there are no pathologic skin lesions or ulcers. NEUROLOGIC: Motor and sensation is grossly normal. Cranial nerves are grossly intact. PSYCH:  Oriented to person, place and time. Affect is normal.  Data Reviewed  I have personally reviewed the patient's imaging,  laboratory findings and medical records.    Assessment/Plan 79 year old female with a strangulated ventral hernia causing bowel obstruction in need for emergent surgical intervention.  Procedure discussed with the patient in detail.  Risk, benefits and possible applications including but not limited to: Bleeding, infection need for bowel resection need for potential reintervention and very high likelihood of hernia recurrence.  For now we will do a damage control surgery . We will go ahead and start resuscitating her with crystalloids and  start broad-spectrum antibiotics.  I informed the OR regarding the urgency of the case.      Caroleen Hamman, MD FACS General Surgeon 04/30/2019, 3:50 PM

## 2019-05-01 ENCOUNTER — Inpatient Hospital Stay: Payer: Medicare Other

## 2019-05-01 LAB — URINALYSIS, COMPLETE (UACMP) WITH MICROSCOPIC
Bilirubin Urine: NEGATIVE
Glucose, UA: >=500 mg/dL — AB
Ketones, ur: 5 mg/dL — AB
Nitrite: POSITIVE — AB
Protein, ur: NEGATIVE mg/dL
Specific Gravity, Urine: 1.04 — ABNORMAL HIGH (ref 1.005–1.030)
pH: 5 (ref 5.0–8.0)

## 2019-05-01 LAB — CBC
HCT: 35.7 % — ABNORMAL LOW (ref 36.0–46.0)
Hemoglobin: 11.3 g/dL — ABNORMAL LOW (ref 12.0–15.0)
MCH: 29.4 pg (ref 26.0–34.0)
MCHC: 31.7 g/dL (ref 30.0–36.0)
MCV: 93 fL (ref 80.0–100.0)
Platelets: 309 10*3/uL (ref 150–400)
RBC: 3.84 MIL/uL — ABNORMAL LOW (ref 3.87–5.11)
RDW: 14.2 % (ref 11.5–15.5)
WBC: 16.9 10*3/uL — ABNORMAL HIGH (ref 4.0–10.5)
nRBC: 0 % (ref 0.0–0.2)

## 2019-05-01 LAB — CREATININE, SERUM
Creatinine, Ser: 1.4 mg/dL — ABNORMAL HIGH (ref 0.44–1.00)
GFR calc Af Amer: 42 mL/min — ABNORMAL LOW (ref >60)
GFR calc non Af Amer: 36 mL/min — ABNORMAL LOW (ref >60)

## 2019-05-01 LAB — HEMOGLOBIN A1C
Hgb A1c MFr Bld: 8.7 % — ABNORMAL HIGH (ref 4.8–5.6)
Mean Plasma Glucose: 202.99 mg/dL

## 2019-05-01 LAB — GLUCOSE, CAPILLARY
Glucose-Capillary: 186 mg/dL — ABNORMAL HIGH (ref 70–99)
Glucose-Capillary: 194 mg/dL — ABNORMAL HIGH (ref 70–99)
Glucose-Capillary: 203 mg/dL — ABNORMAL HIGH (ref 70–99)
Glucose-Capillary: 151 mg/dL — ABNORMAL HIGH (ref 70–99)
Glucose-Capillary: 120 mg/dL — ABNORMAL HIGH (ref 70–99)

## 2019-05-01 MED ORDER — ALBUMIN HUMAN 25 % IV SOLN
12.5000 g | Freq: Once | INTRAVENOUS | Status: AC
  Administered 2019-05-01: 12.5 g via INTRAVENOUS
  Filled 2019-05-01: qty 50

## 2019-05-01 NOTE — Progress Notes (Signed)
After taking patient to the bathroom, observed organ (probable uterus) protruding from vaginal canal. Put patient back in bed and placed moistened abd pads around organ to keep moist. Informed Hospitalist and Surgeon (Dr. Dahlia Byes) who ordered bedrest and keep organ moist until OB/Gyn sees patient tomorrow. No bleeding observed and VS stable.

## 2019-05-01 NOTE — Progress Notes (Signed)
POD # 1 S/p SB resection w ventral H repair Main issue is N/V AVSS Labs ok, bumped creat, hb stable. Wbc reactive KUB reviewed c/w ileus  PE NAD Abd: soft, dressing intact, decrease BS, mildly distended. No peritonits  A/p Post op ileus Keep NPO may need NGT Albumin to increase Intravascular space mobilize

## 2019-05-01 NOTE — Progress Notes (Deleted)
Informed Hospitalist Randol Kern re: patient has fever of 101 and is c/o headache - is NPO with ice chips without any orders for Tylenol.

## 2019-05-01 NOTE — Evaluation (Signed)
Physical Therapy Evaluation Patient Details Name: Brenda Bender MRN: [id number] DOB: [birth year] Today's Date: 05/01/2019   History of Present Illness  Patient is a 79 y/o F that presents s/p SBO procedure on 2/27 to manage ventral hernia.  Clinical Impression  Patient is a 79 y/o F that presents after SBO procedure, she is POD#1 today. She has been living independently with help from her daughter who lives next door prior to this procedure. Today she presents with generalized weakness, does not exhibit any significant apprehension from pain. She is able to take small steps with RW, and requires cga x 1 with RW for transfers. At this time given her high falls risk and decrease in stamina would recommend SNF placement at discharge.    Follow Up Recommendations SNF    Equipment Recommendations  Rolling walker with 5" wheels    Recommendations for Other Services       Precautions / Restrictions Precautions Precautions: Fall Restrictions Weight Bearing Restrictions: No      Mobility  Bed Mobility Overal bed mobility: Needs Assistance Bed Mobility: Supine to Sit     Supine to sit: Min assist     General bed mobility comments: Patient requires min A x 1 to manage elevating her torso off the bed.  Transfers Overall transfer level: Needs assistance Equipment used: Rolling walker (2 wheeled) Transfers: Sit to/from Stand Sit to Stand: Min assist         General transfer comment: Patient able to transfer sit to stand with very minimal assist from chair w/ RW.  Ambulation/Gait Ambulation/Gait assistance: Min guard Gait Distance (Feet): 25 Feet Assistive device: Rolling walker (2 wheeled) Gait Pattern/deviations: WFL(Within Functional Limits)   Gait velocity interpretation: <1.31 ft/sec, indicative of household ambulator General Gait Details: Slow reciprocal steps with RW, no buckling but narrow BOS observed.  Stairs            Wheelchair Mobility    Modified  Rankin (Stroke Patients Only)       Balance Overall balance assessment: Needs assistance Sitting-balance support: Bilateral upper extremity supported Sitting balance-Leahy Scale: Good     Standing balance support: Bilateral upper extremity supported Standing balance-Leahy Scale: Good                               Pertinent Vitals/Pain Pain Assessment: No/denies pain    Home Living Family/patient expects to be discharged to:: Private residence Living Arrangements: Alone Available Help at Discharge: Other (Comment)(Daughter lives next door, patient reports she will live with daughter after discharge) Type of Home: House Home Access: Stairs to enter   [employer] of Steps: 3 Home Layout: One [place]: [place] - 2 wheels;Cane - single point      Prior Function Level of Independence: Independent with assistive device(s)         Comments: Patient was using an AD intermittently for household mobility prior to this procedure.     Hand Dominance        Extremity/Trunk Assessment   Upper Extremity Assessment Upper Extremity Assessment: Generalized weakness    Lower Extremity Assessment Lower Extremity Assessment: Generalized weakness       Communication   Communication: No difficulties  Cognition Arousal/Alertness: Awake/alert   Overall Cognitive Status: Within Functional Limits for tasks assessed  General Comments      Exercises Other Exercises Other Exercises: Assisted patient in transferring to commode with cuing for sequencing, min guard assist required.   Assessment/Plan    PT Assessment Patient needs continued PT services  PT Problem List Decreased strength;Decreased mobility;Decreased safety awareness;Decreased activity tolerance;Cardiopulmonary status limiting activity;Decreased knowledge of use of DME;Decreased balance       PT Treatment Interventions DME  instruction;Therapeutic exercise;Balance training;Gait training;Stair training;Neuromuscular re-education;Cognitive remediation;Functional mobility training;Therapeutic activities;Patient/family education    PT Goals (Current goals can be found in the Care Plan section)  Acute Rehab PT Goals Patient Stated Goal: To return home safely PT Goal Formulation: With patient Time For Goal Achievement: 05/15/19 Potential to Achieve Goals: Good    Frequency Min 2X/week   Barriers to discharge        Co-evaluation               AM-PAC PT "6 Clicks" Mobility  Outcome Measure Help needed turning from your back to your side while in a flat bed without using bedrails?: A Little Help needed moving from lying on your back to sitting on the side of a flat bed without using bedrails?: A Little Help needed moving to and from a bed to a chair (including a wheelchair)?: A Little Help needed standing up from a chair using your arms (e.g., wheelchair or bedside chair)?: A Little Help needed to walk in hospital room?: A Little Help needed climbing 3-5 steps with a railing? : A Lot 6 Click Score: 17    End of Session Equipment Utilized During Treatment: Gait belt Activity Tolerance: Patient tolerated treatment well;Patient limited by fatigue Patient left: in chair;with call bell/phone within reach;with chair alarm set Nurse Communication: Mobility status PT Visit Diagnosis: Unsteadiness on feet (R26.81);Muscle weakness (generalized) (M62.81)    Time: [id number] PT Time Calculation (min) (ACUTE ONLY): 26 min   Charges:   PT Evaluation $PT Eval Moderate Complexity: 1 Mod PT Treatments $Therapeutic Activity: 8-22 mins(Assisted patient with toilet transfer.)    Royce Macadamia PT, DPT, CSCS       05/01/2019, 2:29 PM

## 2019-05-02 ENCOUNTER — Inpatient Hospital Stay: Payer: Medicare Other

## 2019-05-02 LAB — BASIC METABOLIC PANEL
Anion gap: 12 (ref 5–15)
BUN: 25 mg/dL — ABNORMAL HIGH (ref 8–23)
CO2: 22 mmol/L (ref 22–32)
Calcium: 8.2 mg/dL — ABNORMAL LOW (ref 8.9–10.3)
Chloride: 111 mmol/L (ref 98–111)
Creatinine, Ser: 1.05 mg/dL — ABNORMAL HIGH (ref 0.44–1.00)
GFR calc Af Amer: 59 mL/min — ABNORMAL LOW (ref >60)
GFR calc non Af Amer: 51 mL/min — ABNORMAL LOW (ref >60)
Glucose, Bld: 107 mg/dL — ABNORMAL HIGH (ref 70–99)
Potassium: 3.1 mmol/L — ABNORMAL LOW (ref 3.5–5.1)
Sodium: 145 mmol/L (ref 135–145)

## 2019-05-02 LAB — CBC
HCT: 32.7 % — ABNORMAL LOW (ref 36.0–46.0)
Hemoglobin: 10.4 g/dL — ABNORMAL LOW (ref 12.0–15.0)
MCH: 29.8 pg (ref 26.0–34.0)
MCHC: 31.8 g/dL (ref 30.0–36.0)
MCV: 93.7 fL (ref 80.0–100.0)
Platelets: 270 10*3/uL (ref 150–400)
RBC: 3.49 MIL/uL — ABNORMAL LOW (ref 3.87–5.11)
RDW: 14.4 % (ref 11.5–15.5)
WBC: 7.9 10*3/uL (ref 4.0–10.5)
nRBC: 0 % (ref 0.0–0.2)

## 2019-05-02 LAB — GLUCOSE, CAPILLARY
Glucose-Capillary: 191 mg/dL — ABNORMAL HIGH (ref 70–99)
Glucose-Capillary: 90 mg/dL (ref 70–99)
Glucose-Capillary: 85 mg/dL (ref 70–99)
Glucose-Capillary: 70 mg/dL (ref 70–99)
Glucose-Capillary: 86 mg/dL (ref 70–99)

## 2019-05-02 LAB — MAGNESIUM: Magnesium: 2 mg/dL (ref 1.7–2.4)

## 2019-05-02 MED ORDER — DEXTROSE-NACL 5-0.9 % IV SOLN: INTRAVENOUS | Status: DC

## 2019-05-02 NOTE — Progress Notes (Signed)
Physical Therapy Treatment Patient Details Name: Brenda Bender MRN: [id number] DOB: [birth year] Today's Date: 05/02/2019    History of Present Illness Patient is a 79 y/o F that presents s/p SBO procedure on 2/27 to manage ventral hernia.    PT Comments    Pt pleasant and motivated to participate during the session.  Pt training provided on log roll technique for sup to/from sit with pt requiring min A for BLE and trunk control.  Pt was steady with transfers with fair to good control.  Pt ambulated with very slow, effortful cadence a maximum of 40' before requiring to return to sitting secondary to fatigue.  Pt's SpO2 and HR were WNL during the session with pt reporting no increase in baseline pain during the session.  Pt will benefit from PT services in a SNF setting upon discharge to safely address deficits listed in patient problem list for decreased caregiver assistance and eventual return to PLOF.     Follow Up Recommendations  SNF     Equipment Recommendations  Rolling walker with 5" wheels    Recommendations for Other Services       Precautions / Restrictions Precautions Precautions: Fall Restrictions Weight Bearing Restrictions: No Other Position/Activity Restrictions: Abdominal incision    Mobility  Bed Mobility Overal bed mobility: Needs Assistance Bed Mobility: Supine to Sit;Sit to Supine     Supine to sit: Min assist Sit to supine: Min assist   General bed mobility comments: Log roll technique training  Transfers Overall transfer level: Needs assistance Equipment used: Rolling walker (2 wheeled) Transfers: Sit to/from Stand Sit to Stand: Min guard         General transfer comment: Min verbal cues for sequencing  Ambulation/Gait Ambulation/Gait assistance: Min guard Gait Distance (Feet): 40 Feet Assistive device: Rolling walker (2 wheeled) Gait Pattern/deviations: Decreased step length - right;Decreased step length - left;Trunk flexed Gait velocity:  decreased   General Gait Details: Mod verbal cues for amb closer to the RW with upright posture   Stairs             Wheelchair Mobility    Modified Rankin (Stroke Patients Only)       Balance Overall balance assessment: Needs assistance Sitting-balance support: Bilateral upper extremity supported Sitting balance-Leahy Scale: Good     Standing balance support: Bilateral upper extremity supported;During functional activity Standing balance-Leahy Scale: Good                              Cognition Arousal/Alertness: Awake/alert Behavior During Therapy: WFL for tasks assessed/performed Overall Cognitive Status: Within Functional Limits for tasks assessed                                        Exercises Total Joint Exercises Ankle Circles/Pumps: Strengthening;Both;10 reps Quad Sets: Strengthening;Both;10 reps Heel Slides: AAROM;Both;10 reps Hip ABduction/ADduction: AAROM;Both;10 reps Straight Leg Raises: AAROM;Both;10 reps Long Arc Quad: AROM;Strengthening;Both;10 reps Knee Flexion: AROM;Strengthening;Both;10 reps Marching in Standing: AROM;Both;5 reps;Standing    General Comments        Pertinent Vitals/Pain Pain Assessment: 0-10 Pain Score: 8  Pain Location: abdominal area Pain Descriptors / Indicators: Aching;Sore Pain Intervention(s): Premedicated before session;Monitored during session    Home Living                      Prior Function  PT Goals (current goals can now be found in the care plan section) Progress towards PT goals: Progressing toward goals    Frequency    Min 2X/week      PT Plan Current plan remains appropriate    Co-evaluation              AM-PAC PT "6 Clicks" Mobility   Outcome Measure  Help needed turning from your back to your side while in a flat bed without using bedrails?: A Little Help needed moving from lying on your back to sitting on the side of a flat bed  without using bedrails?: A Little Help needed moving to and from a bed to a chair (including a wheelchair)?: A Little Help needed standing up from a chair using your arms (e.g., wheelchair or bedside chair)?: A Little Help needed to walk in hospital room?: A Little Help needed climbing 3-5 steps with a railing? : A Lot 6 Click Score: 17    End of Session Equipment Utilized During Treatment: Gait belt;Other (comment)(Gait belt under arms to avoid incision) Activity Tolerance: Patient tolerated treatment well;No increased pain Patient left: in bed;with bed alarm set;with family/visitor present;with call bell/phone within reach Nurse Communication: Mobility status PT Visit Diagnosis: Unsteadiness on feet (R26.81);Muscle weakness (generalized) (M62.81)     Time: [id number] PT Time Calculation (min) (ACUTE ONLY): 24 min  Charges:  $Gait Training: 8-22 mins $Therapeutic Exercise: 8-22 mins                     D. Scott Elta Angell PT, DPT 05/02/19, 5:17 PM

## 2019-05-02 NOTE — Consult Note (Signed)
Consult History and Physical   SERVICE: Gynecology  Patient Name: Brenda Bender Patient MRN:   [id number]  CC: Uterine prolapse  HPI: Brenda Bender is a 79 y.o. 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 She is able to void but feels her stream is weak. She was moving her bowels as normal prior to her SB resection. She is not currently nauseated and is on clear liquids, without passing gas.  She has not had a hx of ovarian, breast or uterine cancer. No postmenopausal bleeding. No vaginal pain currently except for prolapse manipulation.   She is not sexually active. She has a hx of CHF, hypertension and diabetes.   Review of Systems: positives in bold GEN:   fevers, chills, weight changes, appetite changes, fatigue, night sweats HEENT:  HA, vision changes, hearing loss, congestion, rhinorrhea, sinus pressure, dysphagia CV:   CP, palpitations PULM:  SOB, cough GI:  abd pain, N/V/D/C GU:  dysuria, urgency, frequency MSK:  arthralgias, myalgias, back pain, swelling SKIN:  rashes, color changes, pallor NEURO:  numbness, weakness, tingling, seizures, dizziness, tremors PSYCH:  depression, anxiety, behavioral problems, confusion  HEME/LYMPH:  easy bruising or bleeding ENDO:  heat/cold intolerance  Past Obstetrical History: OB History     Gravida  12   Para      Term      Preterm      AB      Living         SAB      TAB      Ectopic      Multiple      Live Births              Past Gynecologic History: No LMP recorded. Patient is postmenopausal.   Past Medical History: Past Medical History:  Diagnosis Date   Arthritis    CHF (congestive heart failure) ([place])    Diabetes mellitus without complication (H[facility])    HLD (hyperlipidemia)    Hypertension     Past Surgical History:   Past Surgical History:  Procedure Laterality Date    VENTRAL HERNIA REPAIR N/A 04/30/2019   Procedure: HERNIA REPAIR VENTRAL ADULT;  Surgeon: Jules Husbands, MD;  Location: [facility] ORS;  Service: General;  Laterality: N/A;    Family History:  family history is not on file.  Social History:  Social History   Socioeconomic History   Marital status: Widowed    Spouse name: Not on file   Number of children: Not on file   Years of education: Not on file   Highest education level: Not on file  Occupational History   Not on file  Tobacco Use   Smoking status: Never Smoker   Smokeless tobacco: Never Used  Substance and Sexual Activity   Alcohol use: No   Drug use: No   Sexual activity: Not Currently  Other Topics Concern   Not on file  Social History Narrative   Not on file   Social Determinants of Health   Financial Resource Strain:    Difficulty of Paying Living Expenses: Not on file  Food Insecurity:    Worried About [place] in the Last Year: Not on file   Ran Out of Food in the Last Year: Not on file  Transportation Needs:    Lack of Transportation (Medical): Not on file   Lack of  Transportation (Non-Medical): Not on file  Physical Activity:    Days of Exercise per Week: Not on file   Minutes of Exercise per Session: Not on file  Stress:    Feeling of Stress : Not on file  Social Connections:    Frequency of Communication with Friends and Family: Not on file   Frequency of Social Gatherings with Friends and Family: Not on file   Attends Religious Services: Not on file   Active Member of Clubs or Organizations: Not on file   Attends Archivist Meetings: Not on file   Marital Status: Not on file  Intimate Partner Violence:    Fear of Current or Ex-Partner: Not on file   Emotionally Abused: Not on file   Physically Abused: Not on file   Sexually Abused: Not on file    Home Medications:  Medications reconciled in EPIC  No current facility-administered medications on file prior to encounter.    Current Outpatient Medications on File Prior to Encounter  Medication Sig Dispense Refill   aspirin EC 81 MG tablet Take 81 mg by mouth daily.     atorvastatin (LIPITOR) 40 MG tablet Take 40 mg by mouth daily.     canagliflozin (INVOKANA) 100 MG TABS tablet Take 100 mg by mouth daily before breakfast.     fluticasone (FLONASE) 50 MCG/ACT nasal spray Place 1 spray into both nostrils daily.     metoprolol tartrate (LOPRESSOR) 50 MG tablet Take 50 mg by mouth 2 (two) times daily.     quinapril (ACCUPRIL) 40 MG tablet Take 40 mg by mouth 2 (two) times daily.      Allergies:  Allergies  Allergen Reactions   Penicillins Nausea And Vomiting    Physical Exam:  Temp:  [97.6 F (36.4 C)-98.6 F (37 C)] 98.6 F (37 C) (03/01 1345) Pulse Rate:  [54-131] 70 (03/01 1345) Resp:  [16-18] 16 (03/01 0426) BP: (114-146)/(60-72) 146/72 (03/01 1345) SpO2:  [98 %-100 %] 98 % (03/01 1345)   General Appearance:  Well developed, well nourished, no acute distress, alert and oriented x3 Abdomen:  Incision c/d/i. 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     Labs/Studies:   CBC and Coags:  Lab Results  Component Value Date   WBC 7.9 05/02/2019   HGB 10.4 (L) 05/02/2019   HCT 32.7 (L) 05/02/2019   MCV 93.7 05/02/2019   PLT 270 05/02/2019   CMP:  Lab Results  Component Value Date   NA 145 05/02/2019   K 3.1 (L) 05/02/2019   CL 111 05/02/2019   CO2 22 05/02/2019   BUN 25 (H) 05/02/2019   CREATININE 1.05 (H) 05/02/2019   CREATININE 1.40 (H) 05/01/2019   CREATININE 1.08 (H) 04/30/2019   PROT 8.4 (H) 04/30/2019   BILITOT 1.5 (H) 04/30/2019   ALT 10 04/30/2019   AST 18 04/30/2019   ALKPHOS 69 04/30/2019    Other Imaging: CT ABDOMEN PELVIS W CONTRAST  Result  Date: 04/30/2019 CLINICAL DATA:  Abdominal pain.  Concern for incarcerated hernia. EXAM: CT ABDOMEN AND PELVIS WITH CONTRAST TECHNIQUE: Multidetector CT imaging of the abdomen and pelvis was performed using the standard protocol following bolus administration of intravenous contrast. CONTRAST:  151mL OMNIPAQUE IOHEXOL 300 MG/ML  SOLN COMPARISON:  None. FINDINGS: Lower chest: The lung bases  are clear. The heart size is normal. Hepatobiliary: The liver is normal. Normal gallbladder.There is no biliary ductal dilation. Pancreas: Normal contours without ductal dilatation. No peripancreatic fluid collection. Spleen: No splenic laceration or hematoma. Adrenals/Urinary Tract: --Adrenal glands: No adrenal hemorrhage. --Right kidney/ureter: There is mild-to-moderate right-sided hydroureteronephrosis without evidence for an obstructing stone. There is some narrowing of the distal right ureter (axial series 2, image 56). There is no surrounding mass at this location. Beyond this location, the ureter again appears somewhat dilated prior to entering the urinary bladder. --Left kidney/ureter: There is mild to moderate left-sided hydroureteronephrosis without evidence for an obstructing stone. There is some narrowing of the midportion of the ureter (axial series 2, image 47). There is no surrounding mass. --Urinary bladder: The bladder is unremarkable aside from evidence for a cystocele. Stomach/Bowel: --Stomach/Duodenum: The stomach is distended. --Small bowel: There is a moderate to high-grade small bowel obstruction secondary to an upper ventral wall hernia containing a loop of small bowel. The loop of small bowel within this hernia demonstrates mucosal enhancement with a trace amount of adjacent free fluid. There is no adjacent free air. Distal to this loop of small bowel, the small bowel is decompressed. --Colon: There is a rectocele. The colon is relatively decompressed. --Appendix: Normal. Vascular/Lymphatic:  Atherosclerotic calcification is present within the non-aneurysmal abdominal aorta, without hemodynamically significant stenosis. --there is a fluid density retroperitoneal structure measuring approximately 2.7 by 1.9 cm (axial series 2, image 39). This does not appear to connect to the nearby duodenum, especially on the sagittal images. There are few prominent retroperitoneal lymph nodes that are non pathologically enlarged. --No mesenteric lymphadenopathy. --No pelvic or inguinal lymphadenopathy. Reproductive: The uterus is moderately heterogeneous and slightly enlarged for a patient of this age. The patient is status post prior bilateral tubal ligation. Other: There is a small amount of free fluid in the patient's abdomen and pelvis. There is significant pelvic floor laxity with prolapse of the bladder, rectum, and uterus. Musculoskeletal. No acute displaced fractures. IMPRESSION: 1. Moderate to high-grade small bowel obstruction secondary to a small bowel containing ventral wall hernia. There is a small amount of free fluid adjacent to the herniated bowel loop raising concern for incarceration/strangulation. Surgical consultation is recommended. 2. Small amount of free fluid in the abdomen and pelvis, likely reactive. 3. Significant pelvic floor laxity with prolapse of the bladder, rectum, and uterus. 4. Mild to moderate bilateral hydroureteronephrosis without evidence for an obstructing stone. This appearance may be secondary to the presence of a cystocele. 5. Fluid density 2.7 cm structure in the retroperitoneum as detailed above. This is highly concerning for a necrotic pathologically enlarged lymph node. A duodenal diverticulum is another possibility, however a clear connection to the adjacent duodenum is not identified on this study. Follow-up is recommended. 6.  Aortic Atherosclerosis (ICD10-I70.0). Electronically Signed   By: Constance Holster M.D.   On: 04/30/2019 15:13   DG Abd 2 Views  Result  Date: 05/01/2019 CLINICAL DATA:  Vomiting after bowel resection yesterday. EXAM: ABDOMEN - 2 VIEW COMPARISON:  CT scan 04/30/2019 FINDINGS: Upright abdomen shows no definite intraperitoneal free air. Supine film shows marked gaseous distention of small bowel measuring up to 5.1 cm diameter. Stomach is moderately gas distended. Colonic air in stool visualized without colonic distension. IMPRESSION: Marked gaseous distention of small bowel. Imaging features may reflect severe ileus given surgery yesterday, but small bowel obstruction cannot be excluded. Electronically Signed   By: Misty Stanley M.D.   On: 05/01/2019 10:50  DG Abd Portable 2V  Result Date: 05/02/2019 CLINICAL DATA:  Ileus.  Ventral hernia repair 04/30/2019. EXAM: PORTABLE ABDOMEN - 2 VIEW COMPARISON:  05/01/2019 and CT abdomen pelvis 04/30/2019. FINDINGS: Gaseous distention of small bowel. Scattered gas and stool in nondistended colon. IMPRESSION: Marked small bowel distension with gas and stool scattered in nondistended colon, findings typical of a partial small bowel obstruction. In light of recent surgery, an ileus is also considered. Electronically Signed   By: Lorin Picket M.D.   On: 05/02/2019 10:25     Assessment / Plan:   Brenda Bender is a 79 y.o. G11-P11 who presents with procidentia. Her current hospital main concern is recovery from SBO obstruction with SBO resection.  1. The patient is not in pain and is able to void without difficulty. We will f/u with her as an outpatient to setup surgery and referral to urogyn for pelvic floor reconstruction. I will set this up from the office, with the plan to [place] after recovery from her surgery.  This plan was discussed with patient and her 7 adult children who were at the hospital for the discussion.   Thank you for the opportunity to be involved with this pt's care.

## 2019-05-02 NOTE — TOC Initial Note (Signed)
Transition of Care [facility]) - Initial/Assessment Note    Patient Details  Name: Brenda Bender MRN: [id number] Date of Birth: [birth year]  Transition of Care [facility]) CM/SW Contact:    Candie Chroman, LCSW Phone Number: 05/02/2019, 11:27 AM  Clinical Narrative:  CSW met with patient. No supports at bedside. CSW introduced role and explained that PT recommendations would be discussed. Patient is not interested in SNF or home health. She lives alone but her daughter lives next door. Patient stated that she has 11 children that can help her as needed. No further concerns. CSW encouraged patient to contact CSW as needed. CSW will continue to follow patient for support and facilitate return home when stable.                Expected Discharge Plan: Home/Self Care Barriers to Discharge: Continued Medical Work up   Patient Goals and CMS Choice        Expected Discharge Plan and Services Expected Discharge Plan: Home/Self Care       Living arrangements for the past 2 months: Single Family Home                                      Prior Living Arrangements/Services Living arrangements for the past 2 months: Single Family Home Lives with:: Self Patient language and need for interpreter reviewed:: Yes Do you feel safe going back to the place where you live?: Yes      Need for Family Participation in Patient Care: Yes (Comment) Care giver support system in place?: Yes (comment)   Criminal Activity/Legal Involvement Pertinent to Current Situation/Hospitalization: No - Comment as needed  Activities of Daily Living Home Assistive Devices/Equipment: Dentures (specify type)(uppers and lowers) ADL Screening (condition at time of admission) Patient's cognitive ability adequate to safely complete daily activities?: Yes Is the patient deaf or have difficulty hearing?: No Does the patient have difficulty seeing, even when wearing glasses/contacts?: No Does the patient have difficulty  concentrating, remembering, or making decisions?: No Patient able to express need for assistance with ADLs?: No Does the patient have difficulty dressing or bathing?: No Independently performs ADLs?: Yes (appropriate for developmental age) Does the patient have difficulty walking or climbing stairs?: No Weakness of Legs: None Weakness of Arms/Hands: None  Permission Sought/Granted                  Emotional Assessment Appearance:: Appears stated age Attitude/Demeanor/Rapport: Engaged, Gracious Affect (typically observed): Appropriate, Calm, Pleasant Orientation: : Oriented to Self, Oriented to Place, Oriented to  Time, Oriented to Situation Alcohol / Substance Use: Not Applicable Psych Involvement: No (comment)  Admission diagnosis:  SBO (small bowel obstruction) ([place]) [K56.609] Incarcerated hernia [K46.0] Incarcerated ventral hernia [K43.6] Patient Active Problem List   Diagnosis Date Noted  . Incarcerated ventral hernia 04/30/2019   PCP:  Wynonia Sours, PA Pharmacy:   [facility]  [address] [place] [address] Phone: [phone] Fax: [phone]     Social Determinants of Health (SDOH) Interventions    Readmission Risk Interventions No flowsheet data found.

## 2019-05-02 NOTE — Progress Notes (Signed)
Initial Nutrition Assessment  DOCUMENTATION CODES:   Not applicable  INTERVENTION:   RD will monitor for diet advancement vs the need for nutrition support  Pt likely at high refeed risk  NUTRITION DIAGNOSIS:   Inadequate oral intake related to acute illness as evidenced by NPO status.  GOAL:   Patient will meet greater than or equal to 90% of their needs  MONITOR:   Diet advancement, Labs, Weight trends, Skin, I & O's  REASON FOR ASSESSMENT:   Malnutrition Screening Tool    ASSESSMENT:   79 y.o. female with a past medical history of arthritis, CHF, diabetes, hypertension, hyperlipidemia who is s/p ventral hernia repair and small bowel resection for incarcerated ventral hernia [id number] and complicated by post-surgical ileus and prolapsing vagina/uterus  Pt reports poor appetite and oral intake for 1 week pta r/t nausea and vomiting. Pt currently NPO r/t post op ileus. Pt reports continued intermittent nausea currently. No flatus yet. RD will monitor for diet advancement vs the need for nutrition support. Pt is at high refeed risk. There is no recent documented weight history in chart to determine if any significant weight changes.    Medications reviewed and include: lovenox, insulin, protonix, NaCl @100ml /hr  Labs reviewed: K 3.1(L), BUN 25(H), creat 1.05(H), Mg 2.0 wnl Hgb 10.4(L), Hct 32.7(L)  NUTRITION - FOCUSED PHYSICAL EXAM:    Most Recent Value  Orbital Region  No depletion  Upper Arm Region  No depletion  Thoracic and Lumbar Region  No depletion  Buccal Region  No depletion  Temple Region  No depletion  Clavicle Bone Region  No depletion  Clavicle and Acromion Bone Region  No depletion  Scapular Bone Region  No depletion  Dorsal Hand  No depletion  Patellar Region  No depletion  Anterior Thigh Region  No depletion  Posterior Calf Region  No depletion  Edema (RD Assessment)  None  Hair  Reviewed  Eyes  Reviewed  Mouth  Reviewed  Skin  Reviewed  Nails   Reviewed     Diet Order:   Diet Order            Diet NPO time specified Except for: Ice Chips  Diet effective now             EDUCATION NEEDS:   No education needs have been identified at this time  Skin:  Skin Assessment: Reviewed RN Assessment(incision abdomen)  Last BM:  pta  Height:   Ht Readings from Last 1 Encounters:  04/30/19 5\' 3"  (1.6 m)    Weight:   Wt Readings from Last 1 Encounters:  04/30/19 75.8 kg    Ideal Body Weight:  52.3 kg  BMI:  Body mass index is 29.58 kg/m.  Estimated Nutritional Needs:   Kcal:  1600-1800kcal/day  Protein:  80-90g/day  Fluid:  1.6L/day  Koleen Distance MS, RD, LDN Contact information available in Amion

## 2019-05-02 NOTE — Progress Notes (Addendum)
[place] Hospital Day(s): 2.   Post op day(s): 2 Days Post-Op.   Interval History:  Patient seen and examined no acute events or new complaints overnight.  Patient reports her biggest complaint is intermittent nausea. She does note she is "vomiting phlegm."  She denied any significant abdominal pain or distension Leukocytosis resolved, now 7.9K, no fevers sCr improved to 1.05, U/O - 1.0L in last 24 hours Patient currently NPO, no significant flatus   Vital signs in last 24 hours: [min-max] current  Temp:  [97.6 F (36.4 C)-98.2 F (36.8 C)] 98.2 F (36.8 C) (03/01 0426) Pulse Rate:  [54-131] 73 (03/01 0426) Resp:  [16-18] 16 (03/01 0426) BP: (114-143)/(58-64) 143/60 (03/01 0426) SpO2:  [100 %] 100 % (03/01 0426)     Height: 5\' 3"  (160 cm) Weight: 75.8 kg BMI (Calculated): 29.59   Intake/Output last 2 shifts:  02/28 0701 - 03/01 0700 In: 1200.6 [I.V.:1200.6] Out: 1000 [Urine:1000]   Physical Exam:  Constitutional: alert, cooperative and no distress  Respiratory: breathing non-labored at rest  Cardiovascular: regular rate and sinus rhythm  Gastrointestinal: soft, non-tender, mild distension, tympanic to percussion Integumentary: Laparotomy incision is CDI with minimal drainage on honeycomb.  Genitourinary: Chaperone present, she appears to have external prolapse of vagina/uterus   Labs:  CBC Latest Ref Rng & Units 05/02/2019 05/01/2019 04/30/2019  WBC 4.0 - 10.5 K/uL 7.9 16.9(H) 11.7(H)  Hemoglobin 12.0 - 15.0 g/dL 10.4(L) 11.3(L) 13.5  Hematocrit 36.0 - 46.0 % 32.7(L) 35.7(L) 41.2  Platelets 150 - 400 K/uL 270 309 462(H)   CMP Latest Ref Rng & Units 05/02/2019 05/01/2019 04/30/2019  Glucose 70 - 99 mg/dL 107(H) - 290(H)  BUN 8 - 23 mg/dL 25(H) - 20  Creatinine 0.44 - 1.00 mg/dL 1.05(H) 1.40(H) 1.08(H)  Sodium 135 - 145 mmol/L 145 - 134(L)  Potassium 3.5 - 5.1 mmol/L 3.1(L) - 3.1(L)  Chloride 98 - 111 mmol/L 111 - 99  CO2 22 -  32 mmol/L 22 - 23  Calcium 8.9 - 10.3 mg/dL 8.2(L) - 9.4  Total Protein 6.5 - 8.1 g/dL - - 8.4(H)  Total Bilirubin 0.3 - 1.2 mg/dL - - 1.5(H)  Alkaline Phos 38 - 126 U/L - - 69  AST 15 - 41 U/L - - 18  ALT 0 - 44 U/L - - 10     Imaging studies: No new pertinent imaging studies   Assessment/Plan:  79 y.o. female with likely post-surgical ileus and prolapsing vagina/uterus 2 Days Post-Op s/p ventral hernia repair and small bowel resection for incarcerated ventral hernia   - Remain NPO for now; await return of bowel function  - Continue IVF resuscitation  - pain control prn (minimize narcotics if feasible): antiemetics prn  - monitor abdominal examination; on-going bowel function  - Will consult with OB/GYN (secure chat sent to Dr Glennon Mac) for uterine/vaginal prolapse   - Mobilization encouraged; PT following; recommending SNF   - Medical management of comorbid conditions   - DVT Prophylaxis   All of the above findings and recommendations were discussed with the patient, and the medical team, and all of patient's questions were answered to her expressed satisfaction.  -- Edison Simon, PA-C [facility] 05/02/2019, 7:19 AM [phone] M-F: 7am - 4pm  Pt seen and examined. I agree w Mr. Olean Ree [facility] with uterine prolapse and I was unable to reduce it.  I am concerned about potential volvulized or compromise of the uterus.  I discussed with Dr.  Leafy Ro who is aware

## 2019-05-03 LAB — BASIC METABOLIC PANEL
Anion gap: 7 (ref 5–15)
BUN: 25 mg/dL — ABNORMAL HIGH (ref 8–23)
CO2: 23 mmol/L (ref 22–32)
Calcium: 7.8 mg/dL — ABNORMAL LOW (ref 8.9–10.3)
Chloride: 113 mmol/L — ABNORMAL HIGH (ref 98–111)
Creatinine, Ser: 0.93 mg/dL (ref 0.44–1.00)
GFR calc Af Amer: >60 mL/min (ref >60)
GFR calc non Af Amer: 59 mL/min — ABNORMAL LOW (ref >60)
Glucose, Bld: 152 mg/dL — ABNORMAL HIGH (ref 70–99)
Potassium: 2.9 mmol/L — ABNORMAL LOW (ref 3.5–5.1)
Sodium: 143 mmol/L (ref 135–145)

## 2019-05-03 LAB — CBC
HCT: 29.3 % — ABNORMAL LOW (ref 36.0–46.0)
Hemoglobin: 9.1 g/dL — ABNORMAL LOW (ref 12.0–15.0)
MCH: 29.6 pg (ref 26.0–34.0)
MCHC: 31.1 g/dL (ref 30.0–36.0)
MCV: 95.4 fL (ref 80.0–100.0)
Platelets: 244 10*3/uL (ref 150–400)
RBC: 3.07 MIL/uL — ABNORMAL LOW (ref 3.87–5.11)
RDW: 14.3 % (ref 11.5–15.5)
WBC: 7.5 10*3/uL (ref 4.0–10.5)
nRBC: 0 % (ref 0.0–0.2)

## 2019-05-03 LAB — GLUCOSE, CAPILLARY
Glucose-Capillary: 135 mg/dL — ABNORMAL HIGH (ref 70–99)
Glucose-Capillary: 111 mg/dL — ABNORMAL HIGH (ref 70–99)
Glucose-Capillary: 80 mg/dL (ref 70–99)
Glucose-Capillary: 124 mg/dL — ABNORMAL HIGH (ref 70–99)

## 2019-05-03 LAB — SURGICAL PATHOLOGY

## 2019-05-03 MED ORDER — KCL IN DEXTROSE-NACL 20-5-0.9 MEQ/L-%-% IV SOLN
INTRAVENOUS | Status: DC
  Filled 2019-05-03 (×6): qty 1000

## 2019-05-03 MED ORDER — BOOST / RESOURCE BREEZE PO LIQD CUSTOM
1.0000 | Freq: Three times a day (TID) | ORAL | Status: DC
  Administered 2019-05-03 (×3): 1 via ORAL

## 2019-05-03 NOTE — Anesthesia Postprocedure Evaluation (Signed)
Anesthesia Post Note  Patient: Brenda Bender  Procedure(s) Performed: HERNIA REPAIR VENTRAL ADULT (N/A Abdomen)  Patient location during evaluation: PACU Anesthesia Type: General Level of consciousness: awake and alert and oriented Pain management: pain level controlled Vital Signs Assessment: post-procedure vital signs reviewed and stable Respiratory status: spontaneous breathing Cardiovascular status: blood pressure returned to baseline Anesthetic complications: no     Last Vitals:  Vitals:   05/03/19 0823 05/03/19 1136  BP: (!) 146/65 (!) 155/65  Pulse: 60 (!) 59  Resp:  14  Temp:  36.7 C  SpO2:  100%    Last Pain:  Vitals:   05/03/19 1136  TempSrc: Oral  PainSc:                  Selia Wareing

## 2019-05-03 NOTE — Progress Notes (Signed)
   05/03/19 1540  Clinical Encounter Type  Visited With Patient and family together  Visit Type Follow-up  Referral From Chaplain  Consult/Referral To [place] visited with patient and daughter, Brenda Bender. Patient said she is feeling better. Chaplain asked Brenda Bender if her form was signed. She said yes they even faxed it. Thank you. Chaplain offered pastoral presence, empathy, and prayer.

## 2019-05-03 NOTE — Progress Notes (Addendum)
   05/03/19 1140  Clinical Encounter Type  Visited With Family  Visit Type Initial  Referral From Chaplain  Consult/Referral To Chaplain  While doing rounds [place], patient's daughter and spoke with her briefly. Alice said that she needs a form for FMLA. I left word at the nurse's station and the secretary said she would put a note on the chart for the doctor. Chaplain will visit with patient later, she wants to give daughter time with her mother.

## 2019-05-03 NOTE — Progress Notes (Addendum)
[place] Hospital Day(s): 3.   Post op day(s): 3 Days Post-Op.   Interval History:  Patient seen and examined no acute events or new complaints overnight.  Patient reports she is feeling much better this morning after having multiple bowel movements yesterday No abdominal pain, nausea, or emesis Distension markedly improved Worsening hypokalemia today to 2.9 despite repletion yesterday Renal function normalizing, good UO (unmeasured) WBC in normal ranges Slight down trend in hemoglobin, suspected dilutional, no evidence of bleeding KUB 03/01 - concerning for ileus, although gas and stool reached colon She has remained NPO, + multiple BMs overnight and lots of flatus  Mobilized with PT, recommending SNF   Vital signs in last 24 hours: [min-max] current  Temp:  [97.5 F (36.4 C)-98.7 F (37.1 C)] 98.7 F (37.1 C) (03/02 0608) Pulse Rate:  [57-70] 57 (03/02 0608) Resp:  [16] 16 (03/02 0608) BP: (129-159)/(65-72) 129/65 (03/02 0608) SpO2:  [98 %-99 %] 99 % (03/02 0608)     Height: 5\' 3"  (160 cm) Weight: 75.8 kg BMI (Calculated): 29.59   Intake/Output last 2 shifts:  03/01 0701 - 03/02 0700 In: 369.4 [P.O.:60; I.V.:309.4] Out: -    Physical Exam:  Constitutional: alert, cooperative and no distress  Respiratory: breathing non-labored at rest  Cardiovascular: regular rate and sinus rhythm  Gastrointestinal: soft, non-tender, distension improved, no rebound/guarding Integumentary: Laparotomy incision is CDI with minimal drainage on honeycomb.    Labs:  CBC Latest Ref Rng & Units 05/03/2019 05/02/2019 05/01/2019  WBC 4.0 - 10.5 K/uL 7.5 7.9 16.9(H)  Hemoglobin 12.0 - 15.0 g/dL 9.1(L) 10.4(L) 11.3(L)  Hematocrit 36.0 - 46.0 % 29.3(L) 32.7(L) 35.7(L)  Platelets 150 - 400 K/uL 244 270 309   CMP Latest Ref Rng & Units 05/03/2019 05/02/2019 05/01/2019  Glucose 70 - 99 mg/dL 152(H) 107(H) -  BUN 8 - 23 mg/dL 25(H) 25(H) -  Creatinine 0.44 -  1.00 mg/dL 0.93 1.05(H) 1.40(H)  Sodium 135 - 145 mmol/L 143 145 -  Potassium 3.5 - 5.1 mmol/L 2.9(L) 3.1(L) -  Chloride 98 - 111 mmol/L 113(H) 111 -  CO2 22 - 32 mmol/L 23 22 -  Calcium 8.9 - 10.3 mg/dL 7.8(L) 8.2(L) -  Total Protein 6.5 - 8.1 g/dL - - -  Total Bilirubin 0.3 - 1.2 mg/dL - - -  Alkaline Phos 38 - 126 U/L - - -  AST 15 - 41 U/L - - -  ALT 0 - 44 U/L - - -     Imaging studies: No new pertinent imaging studies   Assessment/Plan:  79 y.o. female with clinically resolving post-surgical ileus 3 Days Post-Op s/p ventral hernia repair and small bowel resection for incarcerated ventral hernia   - Will trial on CLD today given return of bowel function             - Continue IVF resuscitation  - Replete K+ in IVF; could also add K+ PO; morning labs             - pain control prn (minimize narcotics if feasible): antiemetics prn             - monitor abdominal examination; on-going bowel function             - Appreciate OB/GYN consult; follow up outpatient at discharge             - Mobilization encouraged; PT following; recommending SNF              -  Medical management of comorbid conditions              - DVT Prophylaxis   All of the above findings and recommendations were discussed with the patient, and the medical team, and all of patient's questions were answered to her expressed satisfaction.  -- Edison Simon, PA-C [facility] 05/03/2019, 7:28 AM [phone] M-F: 7am - 4pm

## 2019-05-03 NOTE — Care Management Important Message (Signed)
Important Message  Patient Details  Name: Brenda Bender MRN: [id number] Date of Birth: September 25, 1940   Medicare Important Message Given:  Yes     Dannette Barbara 05/03/2019, 12:25 PM

## 2019-05-03 NOTE — Plan of Care (Signed)
  Problem: Education: Goal: Required Educational Video(s) Outcome: Progressing   Problem: Skin Integrity: Goal: Demonstration of wound healing without infection will improve Outcome: Progressing   Problem: Clinical Measurements: Goal: Ability to maintain clinical measurements within normal limits will improve Outcome: Progressing Goal: Will remain free from infection Outcome: Progressing   Problem: Nutrition: Goal: Adequate nutrition will be maintained Outcome: Progressing

## 2019-05-04 LAB — CBC
HCT: 33 % — ABNORMAL LOW (ref 36.0–46.0)
Hemoglobin: 10.1 g/dL — ABNORMAL LOW (ref 12.0–15.0)
MCH: 29.4 pg (ref 26.0–34.0)
MCHC: 30.6 g/dL (ref 30.0–36.0)
MCV: 95.9 fL (ref 80.0–100.0)
Platelets: 258 10*3/uL (ref 150–400)
RBC: 3.44 MIL/uL — ABNORMAL LOW (ref 3.87–5.11)
RDW: 14.2 % (ref 11.5–15.5)
WBC: 7 10*3/uL (ref 4.0–10.5)
nRBC: 0 % (ref 0.0–0.2)

## 2019-05-04 LAB — GLUCOSE, CAPILLARY
Glucose-Capillary: 141 mg/dL — ABNORMAL HIGH (ref 70–99)
Glucose-Capillary: 74 mg/dL (ref 70–99)

## 2019-05-04 LAB — BASIC METABOLIC PANEL
Anion gap: 4 — ABNORMAL LOW (ref 5–15)
BUN: 18 mg/dL (ref 8–23)
CO2: 23 mmol/L (ref 22–32)
Calcium: 7.9 mg/dL — ABNORMAL LOW (ref 8.9–10.3)
Chloride: 116 mmol/L — ABNORMAL HIGH (ref 98–111)
Creatinine, Ser: 0.76 mg/dL (ref 0.44–1.00)
GFR calc Af Amer: >60 mL/min (ref >60)
GFR calc non Af Amer: >60 mL/min (ref >60)
Glucose, Bld: 151 mg/dL — ABNORMAL HIGH (ref 70–99)
Potassium: 3.4 mmol/L — ABNORMAL LOW (ref 3.5–5.1)
Sodium: 143 mmol/L (ref 135–145)

## 2019-05-04 MED ORDER — IBUPROFEN 800 MG PO TABS: 800.0000 mg | ORAL_TABLET | Freq: Three times a day (TID) | ORAL | 0 refills | Status: DC | PRN

## 2019-05-04 MED ORDER — OXYCODONE HCL 5 MG PO TABS: 5.0000 mg | ORAL_TABLET | Freq: Four times a day (QID) | ORAL | 0 refills | Status: DC | PRN

## 2019-05-04 MED ORDER — ENSURE ENLIVE PO LIQD
237.0000 mL | Freq: Two times a day (BID) | ORAL | Status: DC
  Administered 2019-05-04: 237 mL via ORAL

## 2019-05-04 NOTE — Discharge Summary (Signed)
[facility] SURGICAL DISCHARGE SUMMARY  Patient ID: Brenda Bender MRN: [id number] DOB/AGE: [birth year] 78 y.o.  Admit date: 04/30/2019 Discharge date: 05/04/2019  Discharge Diagnoses Patient Active Problem List   Diagnosis Date Noted  . Incarcerated ventral hernia 04/30/2019    Consultants None  Procedures 04/30/2019:  ventral hernia repair with small bowel resection   HPI: Brenda Bender is a 79 y.o. female with a 3-day history of abdominal pain.  The pain now is severe and persistent sharp located in the epigastric area.  She also reports nausea and vomiting.  No fevers no chills.  No prior abdominal operations. She is able to perform more than 4 METS of activity without any shortness of breath.  Her comorbidities including CHF diabetes and hypertension.   she is on a daily aspirin. CT scan personally reviewed showing a incarcerated ventral hernia causing a small bowel obstruction due to incarceration.  There is no free air there is some edema within the wall of the bowel. Laboratory data includes an increase in the white count of 11.7 and thrombocytosis.  She does have an acute mild acute kidney injury with a creatinine of 1.08.  She does have her glucose of 290.   Hospital Course: Informed consent was obtained and documented, and patient underwent uneventful ventral hernia repair with small bowel resection for strangulated ventral hernia (Dr Dahlia Byes, 04/30/2019).  Post-operatively, patient had issues with nausea and emesis secondary to post-surgical ileus. This resolved on POD2. Advancement of patient's diet and ambulation were well-tolerated. The remainder of patient's hospital course was essentially unremarkable, and discharge planning was initiated accordingly with patient safely able to be discharged home with appropriate discharge instructions, pain control, and outpatient follow-up after all of her questions were answered to her expressed satisfaction.   Discharge  Condition: Good    Allergies as of 05/04/2019      Reactions   Penicillins Nausea And Vomiting      Medication List    TAKE these medications   aspirin EC 81 MG tablet Take 81 mg by mouth daily.   atorvastatin 40 MG tablet Commonly known as: LIPITOR Take 40 mg by mouth daily.   fluticasone 50 MCG/ACT nasal spray Commonly known as: FLONASE Place 1 spray into both nostrils daily.   ibuprofen 800 MG tablet Commonly known as: ADVIL Take 1 tablet (800 mg total) by mouth every 8 (eight) hours as needed.   Invokana 100 MG Tabs tablet Generic drug: canagliflozin Take 100 mg by mouth daily before breakfast.   metoprolol tartrate 50 MG tablet Commonly known as: LOPRESSOR Take 50 mg by mouth 2 (two) times daily.   oxyCODONE 5 MG immediate release tablet Commonly known as: Oxy IR/ROXICODONE Take 1 tablet (5 mg total) by mouth every 6 (six) hours as needed for severe pain or breakthrough pain.   quinapril 40 MG tablet Commonly known as: ACCUPRIL Take 40 mg by mouth 2 (two) times daily.            Durable Medical Equipment  (From admission, onward)         Start     Ordered   05/04/19 1259  For home use only DME Walker rolling  Once    Question Answer Comment  Walker: With 5 Inch Wheels   Patient needs a walker to treat with the following condition Physical deconditioning      05/04/19 1258   05/04/19 1259  For home use only DME Bedside commode  Once  Question:  Patient needs a bedside commode to treat with the following condition  Answer:  Physical deconditioning   05/04/19 1258           Follow-up Information    Tylene Fantasia, PA-C. Schedule an appointment as soon as possible for a visit in 1 week(s).   Specialty: Physician Assistant Why: s/p ventral hernia repair, has staples Contact information: [address] [place] [address] [phone]            Time spent on discharge management including discussion of hospital course,  clinical condition, outpatient instructions, prescriptions, and follow up with the patient and members of the medical team: >30 minutes  -- Edison Simon , PA-C [facility]  05/04/2019, 1:53 PM [phone] M-F: 7am - 4pm

## 2019-05-04 NOTE — Progress Notes (Signed)
   05/04/19 1005  Clinical Encounter Type  Visited With Patient  Visit Type Follow-up  Referral From Chaplain  Consult/Referral To Chaplain  Doing rounds Chaplain briefly visited with patient. Patient told Chaplain that she needed to speak to a Education officer, museum. Chaplain left and informed someone at the nurses' station of patient's request.

## 2019-05-04 NOTE — TOC Progression Note (Addendum)
Transition of Care [facility]) - Progression Note    Patient Details  Name: Brenda Bender MRN: [id number] Date of Birth: 11/02/1940  Transition of Care [facility]) CM/SW [place], LCSW Phone Number: 05/04/2019, 12:46 PM  Clinical Narrative:   CSW met with patient at bedside due to patient requesting a 3in1 and walker at discharge. Patient reported she does not have these items at home. CSW informed Equipment Representative Brad of need and asked MD to put in order for equipment.  Patient also reported her children plan to provide care to her in her home upon discharge. Patient said her daughter will stay with her overnight and her son will stay with her during the day. Patient denied additional needs at this time.   1:10- Spoke with provider who reported patient will also need [place] talked to patient who is open to these services. She reported she did not have a preference for which agency she would want to use. CSW reaching out to agencies about availability.  1:25- Spoke with Advanced Representative Corene Cornea who reported they will be able to accept patient. Corene Cornea to meet with patient today to discuss beginning services. CSW signing off.    Expected Discharge Plan: Home/Self Care Barriers to Discharge: Continued Medical Work up  Expected Discharge Plan and Services Expected Discharge Plan: Home/Self Care       Living arrangements for the past 2 months: Single Family Home                                       Social Determinants of Health (SDOH) Interventions    Readmission Risk Interventions No flowsheet data found.

## 2019-05-04 NOTE — Progress Notes (Signed)
[place] [facility] Day(s): 4.   Post op day(s): 4 Days Post-Op.   Interval History:  Patient seen and examined no acute events or new complaints overnight.  Patient reports she continues to feel well No complaints of abdominal pain, nausea, or emesis. Distension remains resovled Hypokalemia nearly resolved, K+ 3.4 today after repletion Renal function normalized Started on CLD yesterday, tolerated well, +flatus & BMs Mobilizing with PT; recommending SNF   Vital signs in last 24 hours: [min-max] current  Temp:  [97.6 F (36.4 C)-98.6 F (37 C)] 98.6 F (37 C) (03/03 0415) Pulse Rate:  [59-65] 61 (03/03 0415) Resp:  [14-16] 16 (03/03 0415) BP: (123-177)/(65-73) 139/69 (03/03 0415) SpO2:  [96 %-100 %] 99 % (03/03 0415)     Height: 5\' 3"  (160 cm) Weight: 75.8 kg BMI (Calculated): 29.59   Intake/Output last 2 shifts:  03/02 0701 - 03/03 0700 In: 503.9 [I.V.:503.9] Out: -    Physical Exam:  Constitutional: alert, cooperative and no distress  Respiratory: breathing non-labored at rest  Cardiovascular: regular rate and sinus rhythm  Gastrointestinal: soft, non-tender,distension resolved, no rebound/guarding Integumentary:Laparotomy incision is CDI with staples, no erythema or drainage.    Labs:  CBC Latest Ref Rng & Units 05/04/2019 05/03/2019 05/02/2019  WBC 4.0 - 10.5 K/uL 7.0 7.5 7.9  Hemoglobin 12.0 - 15.0 g/dL 10.1(L) 9.1(L) 10.4(L)  Hematocrit 36.0 - 46.0 % 33.0(L) 29.3(L) 32.7(L)  Platelets 150 - 400 K/uL 258 244 270   CMP Latest Ref Rng & Units 05/04/2019 05/03/2019 05/02/2019  Glucose 70 - 99 mg/dL 151(H) 152(H) 107(H)  BUN 8 - 23 mg/dL 18 25(H) 25(H)  Creatinine 0.44 - 1.00 mg/dL 0.76 0.93 1.05(H)  Sodium 135 - 145 mmol/L 143 143 145  Potassium 3.5 - 5.1 mmol/L 3.4(L) 2.9(L) 3.1(L)  Chloride 98 - 111 mmol/L 116(H) 113(H) 111  CO2 22 - 32 mmol/L 23 23 22   Calcium 8.9 - 10.3 mg/dL 7.9(L) 7.8(L) 8.2(L)  Total Protein 6.5 - 8.1  g/dL - - -  Total Bilirubin 0.3 - 1.2 mg/dL - - -  Alkaline Phos 38 - 126 U/L - - -  AST 15 - 41 U/L - - -  ALT 0 - 44 U/L - - -     Imaging studies: No new pertinent imaging studies   Assessment/Plan: 79 y.o. female with resolved post-surgical ileus 4 Days Post-Op s/p ventral hernia repair and small bowel resectionfor incarcerated ventral hernia   - Advance to full liquids this morning, soft for dinner if tolerating  - Wean IVF resuscitation             - Replete K+ in IVF; morning labs - pain control prn (minimize narcotics if feasible): antiemetics prn - monitor abdominal examination; on-going bowel function - Appreciate OB/GYN consult; follow up outpatient at discharge - Mobilization encouraged; PT following; recommending SNF - Medical management of comorbid conditions - DVT Prophylaxis   - Discharge Planning: If tolerates advancement of diet today hopefully home in next 24-48 hours pending placement  All of the above findings and recommendations were discussed with the patient, and the medical team, and all of patient's questions were answered to her expressed satisfaction.  -- Edison Simon, PA-C [facility] 05/04/2019, 7:24 AM [phone] M-F: 7am - 4pm

## 2019-05-05 ENCOUNTER — Other Ambulatory Visit: Payer: Self-pay

## 2019-05-05 MED ORDER — ONDANSETRON HCL 4 MG PO TABS: 4.0000 mg | ORAL_TABLET | Freq: Three times a day (TID) | ORAL | 0 refills | Status: DC | PRN

## 2019-05-05 MED ORDER — ONDANSETRON 4 MG PO TBDP: 4.0000 mg | ORAL_TABLET | Freq: Three times a day (TID) | ORAL | 0 refills | Status: DC | PRN

## 2019-05-11 ENCOUNTER — Encounter: Payer: Medicare Other | Admitting: Physician Assistant

## 2019-05-16 ENCOUNTER — Ambulatory Visit (INDEPENDENT_AMBULATORY_CARE_PROVIDER_SITE_OTHER): Payer: Self-pay | Admitting: Surgery

## 2019-05-16 ENCOUNTER — Other Ambulatory Visit: Payer: Self-pay

## 2019-05-16 ENCOUNTER — Encounter: Payer: Self-pay | Admitting: Surgery

## 2019-05-16 VITALS — BP 160/77 | HR 55 | Temp 97.3°F | Resp 14 | Ht 69.0 in | Wt 166.0 lb

## 2019-05-16 DIAGNOSIS — Z09 Encounter for follow-up examination after completed treatment for conditions other than malignant neoplasm: Secondary | ICD-10-CM

## 2019-05-16 NOTE — Patient Instructions (Signed)
We removed the staples today. We placed the steri strips and these will begin to fall off within 7-10 days. You may shower as usual however do not scrub over the white strips. Pat them dry.   Please call with any questions or concerns.

## 2019-05-16 NOTE — Progress Notes (Signed)
S/p open ventral hernia repair w SB resection Doing well Taking po No fever or chills  PE NAD Abd: soft, staples removed. No infection or recurrence  A/P Doing well No heavy lifting Vaginal prolapse pending GYN

## 2019-09-06 DIAGNOSIS — J301 Allergic rhinitis due to pollen: Secondary | ICD-10-CM | POA: Insufficient documentation

## 2019-09-06 DIAGNOSIS — E782 Mixed hyperlipidemia: Secondary | ICD-10-CM | POA: Insufficient documentation

## 2019-09-07 ENCOUNTER — Emergency Department: Payer: Medicare Other

## 2019-09-07 ENCOUNTER — Other Ambulatory Visit: Payer: Self-pay

## 2019-09-07 ENCOUNTER — Encounter: Payer: Self-pay | Admitting: Emergency Medicine

## 2019-09-07 ENCOUNTER — Emergency Department
Admission: EM | Admit: 2019-09-07 | Discharge: 2019-09-07 | Disposition: A | Discharge status: Home / Self Care | Payer: Medicare Other | Attending: Emergency Medicine | Admitting: Emergency Medicine

## 2019-09-07 DIAGNOSIS — S92421A Displaced fracture of distal phalanx of right great toe, initial encounter for closed fracture: Secondary | ICD-10-CM | POA: Diagnosis not present

## 2019-09-07 DIAGNOSIS — R0789 Other chest pain: Secondary | ICD-10-CM | POA: Insufficient documentation

## 2019-09-07 DIAGNOSIS — E119 Type 2 diabetes mellitus without complications: Secondary | ICD-10-CM | POA: Insufficient documentation

## 2019-09-07 DIAGNOSIS — Y92410 Unspecified street and highway as the place of occurrence of the external cause: Secondary | ICD-10-CM | POA: Insufficient documentation

## 2019-09-07 DIAGNOSIS — M545 Low back pain: Secondary | ICD-10-CM | POA: Diagnosis not present

## 2019-09-07 DIAGNOSIS — Y999 Unspecified external cause status: Secondary | ICD-10-CM | POA: Insufficient documentation

## 2019-09-07 DIAGNOSIS — S93111A Dislocation of interphalangeal joint of right great toe, initial encounter: Secondary | ICD-10-CM | POA: Insufficient documentation

## 2019-09-07 DIAGNOSIS — I509 Heart failure, unspecified: Secondary | ICD-10-CM | POA: Insufficient documentation

## 2019-09-07 DIAGNOSIS — M542 Cervicalgia: Secondary | ICD-10-CM | POA: Insufficient documentation

## 2019-09-07 DIAGNOSIS — S0101XA Laceration without foreign body of scalp, initial encounter: Secondary | ICD-10-CM | POA: Diagnosis not present

## 2019-09-07 DIAGNOSIS — Y9389 Activity, other specified: Secondary | ICD-10-CM | POA: Insufficient documentation

## 2019-09-07 DIAGNOSIS — S0990XA Unspecified injury of head, initial encounter: Secondary | ICD-10-CM | POA: Diagnosis present

## 2019-09-07 DIAGNOSIS — S92401A Displaced unspecified fracture of right great toe, initial encounter for closed fracture: Secondary | ICD-10-CM

## 2019-09-07 DIAGNOSIS — I110 Hypertensive heart disease with heart failure: Secondary | ICD-10-CM | POA: Insufficient documentation

## 2019-09-07 DIAGNOSIS — S92911A Unspecified fracture of right toe(s), initial encounter for closed fracture: Secondary | ICD-10-CM

## 2019-09-07 MED ORDER — TRAMADOL HCL 50 MG PO TABS: 50.0000 mg | ORAL_TABLET | Freq: Two times a day (BID) | ORAL | 0 refills | Status: DC | PRN

## 2019-09-07 MED ORDER — TRAMADOL HCL 50 MG PO TABS
50.0000 mg | ORAL_TABLET | Freq: Once | ORAL | Status: AC
  Administered 2019-09-07: 50 mg via ORAL
  Filled 2019-09-07: qty 1

## 2019-09-07 MED ORDER — TRAMADOL HCL 50 MG PO TABS: 50.0000 mg | ORAL_TABLET | Freq: Two times a day (BID) | ORAL | 0 refills | Status: AC | PRN

## 2019-09-07 MED ORDER — HYDROMORPHONE HCL 1 MG/ML IJ SOLN
1.0000 mg | Freq: Once | INTRAMUSCULAR | Status: DC
  Filled 2019-09-07: qty 1

## 2019-09-07 MED ORDER — LIDOCAINE-EPINEPHRINE-TETRACAINE (LET) TOPICAL GEL: 3.0000 mL | Freq: Once | TOPICAL | Status: DC

## 2019-09-07 NOTE — ED Triage Notes (Signed)
Pt son in 1 and yelling, states that he wants his mom to leave and go to [facility]. Encouraged pt to stay and be examined and triaged. Pt son upset and cursing at staff.
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
## 2019-09-07 NOTE — ED Notes (Signed)
c-collar removed by provider

## 2019-09-07 NOTE — ED Notes (Signed)
Discharge instructions gone over with family

## 2020-02-20 DIAGNOSIS — N813 Complete uterovaginal prolapse: Secondary | ICD-10-CM | POA: Insufficient documentation

## 2021-08-31 ENCOUNTER — Encounter: Payer: Self-pay | Admitting: Emergency Medicine

## 2021-08-31 ENCOUNTER — Other Ambulatory Visit: Payer: Self-pay

## 2021-08-31 ENCOUNTER — Inpatient Hospital Stay
Admission: EM | Admit: 2021-08-31 | Discharge: 2021-09-03 | DRG: 374 | Disposition: A | Discharge status: Home Health | Payer: Medicare Other | Attending: Internal Medicine | Admitting: Internal Medicine

## 2021-08-31 ENCOUNTER — Emergency Department: Payer: Medicare Other

## 2021-08-31 DIAGNOSIS — I8290 Acute embolism and thrombosis of unspecified vein: Secondary | ICD-10-CM

## 2021-08-31 DIAGNOSIS — C772 Secondary and unspecified malignant neoplasm of intra-abdominal lymph nodes: Secondary | ICD-10-CM | POA: Diagnosis present

## 2021-08-31 DIAGNOSIS — Z7984 Long term (current) use of oral hypoglycemic drugs: Secondary | ICD-10-CM | POA: Diagnosis not present

## 2021-08-31 DIAGNOSIS — E1165 Type 2 diabetes mellitus with hyperglycemia: Secondary | ICD-10-CM | POA: Diagnosis present

## 2021-08-31 DIAGNOSIS — M6289 Other specified disorders of muscle: Principal | ICD-10-CM

## 2021-08-31 DIAGNOSIS — I82890 Acute embolism and thrombosis of other specified veins: Secondary | ICD-10-CM | POA: Diagnosis present

## 2021-08-31 DIAGNOSIS — M1990 Unspecified osteoarthritis, unspecified site: Secondary | ICD-10-CM | POA: Diagnosis present

## 2021-08-31 DIAGNOSIS — E876 Hypokalemia: Secondary | ICD-10-CM | POA: Diagnosis present

## 2021-08-31 DIAGNOSIS — I10 Essential (primary) hypertension: Secondary | ICD-10-CM | POA: Diagnosis present

## 2021-08-31 DIAGNOSIS — D649 Anemia, unspecified: Secondary | ICD-10-CM | POA: Diagnosis present

## 2021-08-31 DIAGNOSIS — M8958 Osteolysis, other site: Secondary | ICD-10-CM | POA: Diagnosis present

## 2021-08-31 DIAGNOSIS — E860 Dehydration: Secondary | ICD-10-CM | POA: Diagnosis present

## 2021-08-31 DIAGNOSIS — R300 Dysuria: Secondary | ICD-10-CM | POA: Diagnosis present

## 2021-08-31 DIAGNOSIS — Z90710 Acquired absence of both cervix and uterus: Secondary | ICD-10-CM | POA: Diagnosis not present

## 2021-08-31 DIAGNOSIS — K5900 Constipation, unspecified: Secondary | ICD-10-CM | POA: Diagnosis present

## 2021-08-31 DIAGNOSIS — Z880 Allergy status to penicillin: Secondary | ICD-10-CM | POA: Diagnosis not present

## 2021-08-31 DIAGNOSIS — D72829 Elevated white blood cell count, unspecified: Secondary | ICD-10-CM | POA: Diagnosis present

## 2021-08-31 DIAGNOSIS — Z66 Do not resuscitate: Secondary | ICD-10-CM | POA: Diagnosis present

## 2021-08-31 DIAGNOSIS — E861 Hypovolemia: Secondary | ICD-10-CM | POA: Diagnosis present

## 2021-08-31 DIAGNOSIS — D630 Anemia in neoplastic disease: Secondary | ICD-10-CM | POA: Diagnosis present

## 2021-08-31 DIAGNOSIS — Z8542 Personal history of malignant neoplasm of other parts of uterus: Secondary | ICD-10-CM | POA: Diagnosis not present

## 2021-08-31 DIAGNOSIS — E782 Mixed hyperlipidemia: Secondary | ICD-10-CM | POA: Diagnosis present

## 2021-08-31 DIAGNOSIS — I82220 Acute embolism and thrombosis of inferior vena cava: Secondary | ICD-10-CM | POA: Diagnosis present

## 2021-08-31 DIAGNOSIS — Z7982 Long term (current) use of aspirin: Secondary | ICD-10-CM

## 2021-08-31 DIAGNOSIS — E871 Hypo-osmolality and hyponatremia: Secondary | ICD-10-CM | POA: Diagnosis present

## 2021-08-31 DIAGNOSIS — E1169 Type 2 diabetes mellitus with other specified complication: Secondary | ICD-10-CM | POA: Diagnosis present

## 2021-08-31 DIAGNOSIS — R3915 Urgency of urination: Secondary | ICD-10-CM | POA: Diagnosis present

## 2021-08-31 DIAGNOSIS — Z6825 Body mass index (BMI) 25.0-25.9, adult: Secondary | ICD-10-CM

## 2021-08-31 DIAGNOSIS — R309 Painful micturition, unspecified: Secondary | ICD-10-CM | POA: Diagnosis present

## 2021-08-31 DIAGNOSIS — E46 Unspecified protein-calorie malnutrition: Secondary | ICD-10-CM | POA: Diagnosis present

## 2021-08-31 DIAGNOSIS — E119 Type 2 diabetes mellitus without complications: Secondary | ICD-10-CM | POA: Insufficient documentation

## 2021-08-31 DIAGNOSIS — C541 Malignant neoplasm of endometrium: Secondary | ICD-10-CM | POA: Diagnosis present

## 2021-08-31 DIAGNOSIS — C786 Secondary malignant neoplasm of retroperitoneum and peritoneum: Secondary | ICD-10-CM | POA: Diagnosis present

## 2021-08-31 DIAGNOSIS — R54 Age-related physical debility: Secondary | ICD-10-CM | POA: Diagnosis present

## 2021-08-31 DIAGNOSIS — R102 Pelvic and perineal pain: Secondary | ICD-10-CM | POA: Diagnosis present

## 2021-08-31 DIAGNOSIS — I82409 Acute embolism and thrombosis of unspecified deep veins of unspecified lower extremity: Secondary | ICD-10-CM | POA: Diagnosis present

## 2021-08-31 DIAGNOSIS — Z8679 Personal history of other diseases of the circulatory system: Secondary | ICD-10-CM

## 2021-08-31 LAB — PROTIME-INR
INR: 1.1 (ref 0.8–1.2)
Prothrombin Time: 13.9 seconds (ref 11.4–15.2)

## 2021-08-31 LAB — URINALYSIS, ROUTINE W REFLEX MICROSCOPIC
Bacteria, UA: NONE SEEN
Bilirubin Urine: NEGATIVE
Glucose, UA: NEGATIVE mg/dL
Hgb urine dipstick: NEGATIVE
Ketones, ur: 5 mg/dL — AB
Nitrite: NEGATIVE
Protein, ur: NEGATIVE mg/dL
Specific Gravity, Urine: 1.011 (ref 1.005–1.030)
pH: 7 (ref 5.0–8.0)

## 2021-08-31 LAB — CBC
HCT: 32.5 % — ABNORMAL LOW (ref 36.0–46.0)
Hemoglobin: 10.1 g/dL — ABNORMAL LOW (ref 12.0–15.0)
MCH: 28.2 pg (ref 26.0–34.0)
MCHC: 31.1 g/dL (ref 30.0–36.0)
MCV: 90.8 fL (ref 80.0–100.0)
Platelets: 432 10*3/uL — ABNORMAL HIGH (ref 150–400)
RBC: 3.58 MIL/uL — ABNORMAL LOW (ref 3.87–5.11)
RDW: 13.7 % (ref 11.5–15.5)
WBC: 11.8 10*3/uL — ABNORMAL HIGH (ref 4.0–10.5)
nRBC: 0 % (ref 0.0–0.2)

## 2021-08-31 LAB — COMPREHENSIVE METABOLIC PANEL
ALT: 8 U/L (ref 0–44)
AST: 20 U/L (ref 15–41)
Albumin: 3.3 g/dL — ABNORMAL LOW (ref 3.5–5.0)
Alkaline Phosphatase: 62 U/L (ref 38–126)
Anion gap: 10 (ref 5–15)
BUN: 12 mg/dL (ref 8–23)
CO2: 23 mmol/L (ref 22–32)
Calcium: 8.9 mg/dL (ref 8.9–10.3)
Chloride: 97 mmol/L — ABNORMAL LOW (ref 98–111)
Creatinine, Ser: 0.63 mg/dL (ref 0.44–1.00)
GFR, Estimated: >60 mL/min (ref >60)
Glucose, Bld: 150 mg/dL — ABNORMAL HIGH (ref 70–99)
Potassium: 3.4 mmol/L — ABNORMAL LOW (ref 3.5–5.1)
Sodium: 130 mmol/L — ABNORMAL LOW (ref 135–145)
Total Bilirubin: 0.9 mg/dL (ref 0.3–1.2)
Total Protein: 7.8 g/dL (ref 6.5–8.1)

## 2021-08-31 LAB — GLUCOSE, CAPILLARY: Glucose-Capillary: 125 mg/dL — ABNORMAL HIGH (ref 70–99)

## 2021-08-31 LAB — LIPASE, BLOOD: Lipase: 26 U/L (ref 11–51)

## 2021-08-31 LAB — APTT: aPTT: 32 seconds (ref 24–36)

## 2021-08-31 MED ORDER — HEPARIN BOLUS VIA INFUSION
3600.0000 [IU] | Freq: Once | INTRAVENOUS | Status: AC
Start: 2021-08-31 — End: 2021-08-31
  Administered 2021-08-31: 3600 [IU] via INTRAVENOUS
  Filled 2021-08-31: qty 3600

## 2021-08-31 MED ORDER — FLUTICASONE PROPIONATE 50 MCG/ACT NA SUSP
1.0000 | Freq: Every day | NASAL | Status: DC | PRN
Start: 2021-08-31 — End: 2021-09-03

## 2021-08-31 MED ORDER — POTASSIUM CHLORIDE 20 MEQ PO PACK
40.0000 meq | PACK | Freq: Once | ORAL | Status: AC
Start: 2021-08-31 — End: 2021-08-31
  Administered 2021-08-31: 40 meq via ORAL
  Filled 2021-08-31: qty 2

## 2021-08-31 MED ORDER — QUINAPRIL HCL 10 MG PO TABS: 40.0000 mg | ORAL_TABLET | Freq: Two times a day (BID) | ORAL | Status: DC

## 2021-08-31 MED ORDER — OXYCODONE-ACETAMINOPHEN 5-325 MG PO TABS
1.0000 | ORAL_TABLET | ORAL | Status: DC | PRN
  Administered 2021-08-31 – 2021-09-03 (×4): 1 via ORAL
  Filled 2021-08-31 (×4): qty 1

## 2021-08-31 MED ORDER — ONDANSETRON HCL 4 MG/2ML IJ SOLN
4.0000 mg | Freq: Once | INTRAMUSCULAR | Status: DC
  Filled 2021-08-31: qty 2

## 2021-08-31 MED ORDER — MELATONIN 5 MG PO TABS
10.0000 mg | ORAL_TABLET | Freq: Every evening | ORAL | Status: DC | PRN
  Administered 2021-09-02: 10 mg via ORAL
  Filled 2021-08-31: qty 2

## 2021-08-31 MED ORDER — ACETAMINOPHEN 650 MG RE SUPP: 650.0000 mg | Freq: Four times a day (QID) | RECTAL | Status: DC | PRN

## 2021-08-31 MED ORDER — HYDRALAZINE HCL 20 MG/ML IJ SOLN
10.0000 mg | Freq: Four times a day (QID) | INTRAMUSCULAR | Status: DC | PRN
  Administered 2021-09-01: 10 mg via INTRAVENOUS
  Filled 2021-08-31: qty 1

## 2021-08-31 MED ORDER — ONDANSETRON HCL 4 MG/2ML IJ SOLN: 4.0000 mg | Freq: Four times a day (QID) | INTRAMUSCULAR | Status: DC | PRN

## 2021-08-31 MED ORDER — INSULIN ASPART 100 UNIT/ML IJ SOLN
0.0000 [IU] | Freq: Three times a day (TID) | INTRAMUSCULAR | Status: DC
  Administered 2021-09-01 (×2): 2 [IU] via SUBCUTANEOUS
  Filled 2021-08-31 (×2): qty 1

## 2021-08-31 MED ORDER — ATORVASTATIN CALCIUM 20 MG PO TABS
40.0000 mg | ORAL_TABLET | Freq: Every day | ORAL | Status: DC
  Administered 2021-09-02 – 2021-09-03 (×2): 40 mg via ORAL
  Filled 2021-08-31 (×3): qty 2

## 2021-08-31 MED ORDER — SODIUM CHLORIDE 0.9% FLUSH
3.0000 mL | Freq: Two times a day (BID) | INTRAVENOUS | Status: DC
  Administered 2021-08-31 – 2021-09-03 (×4): 3 mL via INTRAVENOUS

## 2021-08-31 MED ORDER — SODIUM CHLORIDE 0.9 % IV BOLUS
1000.0000 mL | Freq: Once | INTRAVENOUS | Status: AC
  Administered 2021-08-31: 1000 mL via INTRAVENOUS

## 2021-08-31 MED ORDER — HEPARIN (PORCINE) 25000 UT/250ML-% IV SOLN
900.0000 [IU]/h | INTRAVENOUS | Status: DC
  Administered 2021-08-31: 1000 [IU]/h via INTRAVENOUS
  Administered 2021-09-01 – 2021-09-02 (×3): 900 [IU]/h via INTRAVENOUS
  Filled 2021-08-31 (×3): qty 250

## 2021-08-31 MED ORDER — ONDANSETRON HCL 4 MG PO TABS: 4.0000 mg | ORAL_TABLET | Freq: Four times a day (QID) | ORAL | Status: DC | PRN

## 2021-08-31 MED ORDER — MORPHINE SULFATE (PF) 4 MG/ML IV SOLN: 4.0000 mg | INTRAVENOUS | Status: DC | PRN

## 2021-08-31 MED ORDER — MORPHINE SULFATE (PF) 4 MG/ML IV SOLN
4.0000 mg | Freq: Once | INTRAVENOUS | Status: DC
  Filled 2021-08-31: qty 1

## 2021-08-31 MED ORDER — METOPROLOL TARTRATE 50 MG PO TABS
50.0000 mg | ORAL_TABLET | Freq: Two times a day (BID) | ORAL | Status: DC
  Administered 2021-08-31 – 2021-09-03 (×5): 50 mg via ORAL
  Filled 2021-08-31 (×6): qty 1

## 2021-08-31 MED ORDER — LACTATED RINGERS IV SOLN
INTRAVENOUS | Status: DC
Start: 2021-08-31 — End: 2021-09-01

## 2021-08-31 MED ORDER — ACETAMINOPHEN 325 MG PO TABS
650.0000 mg | ORAL_TABLET | Freq: Four times a day (QID) | ORAL | Status: DC | PRN
  Administered 2021-09-01 – 2021-09-03 (×3): 650 mg via ORAL
  Filled 2021-08-31 (×3): qty 2

## 2021-08-31 MED ORDER — IOHEXOL 300 MG/ML  SOLN
80.0000 mL | Freq: Once | INTRAMUSCULAR | Status: AC | PRN
  Administered 2021-08-31: 80 mL via INTRAVENOUS

## 2021-08-31 MED ORDER — POLYETHYLENE GLYCOL 3350 17 G PO PACK
17.0000 g | PACK | Freq: Every day | ORAL | Status: DC | PRN
  Administered 2021-09-01: 17 g via ORAL
  Filled 2021-08-31: qty 1

## 2021-08-31 NOTE — Assessment & Plan Note (Addendum)
Resumed on home antihypertensives --metoprolol and losartan. --added amlodipine 5 mg as BP remains elevated -- Close PCP follow-up --recommended home BP monitoring between discharge and PCP visit

## 2021-08-31 NOTE — Assessment & Plan Note (Signed)
   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

## 2021-08-31 NOTE — Assessment & Plan Note (Signed)
-   Continue home statin °

## 2021-08-31 NOTE — Progress Notes (Signed)
ANTICOAGULATION CONSULT NOTE - Initial Consult  Pharmacy Consult for Heparin Drip Indication: DVT  Allergies  Allergen Reactions   Penicillins Nausea And Vomiting    Patient Measurements: Height: 5' 2.5" (158.8 cm) Weight: 60.8 kg (134 lb) IBW/kg (Calculated) : 51.25 Heparin Dosing Weight:    Vital Signs: Temp: 97.5 F (36.4 C) (07/01 1220) Temp Source: Oral (07/01 1220) BP: 197/93 (07/01 1600) Pulse Rate: 73 (07/01 1730)  Labs: Recent Labs    08/31/21 1223  HGB 10.1*  HCT 32.5*  PLT 432*  CREATININE 0.63    Estimated Creatinine Clearance: 45.4 mL/min (by C-G formula based on SCr of 0.63 mg/dL).   Medical History: Past Medical History:  Diagnosis Date   Arthritis    CHF (congestive heart failure) ([place])    Diabetes mellitus without complication (HCC)    HLD (hyperlipidemia)    Hypertension    Assessment: Patient is an 81yo female admitted for abdominal pain. 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 Pharmacy consulted for Heparin dosing.  Reviewed prior to admission medications and no anticoagulants noted.  Goal of Therapy:  Heparin level 0.3-0.7 units/ml Monitor platelets by anticoagulation protocol: Yes   Plan:  Give 3600 units bolus x 1 Start heparin infusion at 1000 units/hr Check anti-Xa level in 8 hours and daily while on heparin Continue to monitor H&H and platelets  Paulina Fusi, PharmD, BCPS 08/31/2021 7:17 PM

## 2021-08-31 NOTE — ED Provider Notes (Signed)
[facility] Provider Note    Event Date/Time   First MD Initiated Contact with Patient 08/31/21 1503     (approximate)   History   Dysuria   HPI  Brenda Bender is a 81 y.o. female   With past medical history of hypertension, hyperlipidemia, diabetes, CHF, here with lower abdominal pain, pain with bowel movements, as well as pain with urination.  The patient states that for the last several weeks, she has had progressively worsening constipation, pain with bowel movements, and lower abdominal fullness and cramping.  She has also noticed dysuria and sensation like she is not emptying completely.  She was seen recently by PCP and placed on Macrobid.  She states she has been taking this, but her urinary urgency and sensation of not completely emptying persist, and she also has ongoing pain with bowel movements.  Reports her bowel movements are very small, like "rabbit's" and minimally painful.  Denies any vomiting.  No known history of impactions or obstructions.  No fevers or chills.  No recent medication changes.      Physical Exam   Triage Vital Signs: ED Triage Vitals [08/31/21 1220]  Enc Vitals Group     BP (!) 156/77     Pulse Rate 77     Resp 18     Temp (!) 97.5 F (36.4 C)     Temp Source Oral     SpO2 100 %     Weight 134 lb (60.8 kg)     Height 5' 2.5" (1.588 m)     Head Circumference      Peak Flow      Pain Score 10     Pain Loc      Pain Edu?      Excl. in [place]?     Most recent vital signs: Vitals:   08/31/21 1935 08/31/21 2101  BP: (!) 175/83 (!) 193/85  Pulse: 82 86  Resp: 18 18  Temp:  98.3 F (36.8 C)  SpO2: 97% 100%     General: Awake, no distress.  CV:  Good peripheral perfusion.  Regular rate and rhythm.  No murmurs. Resp:  Normal effort.  Lungs clear. Abd:  No distention.  Minimal suprapubic tenderness.  No rebound or guarding.  No distention. Other:  Moist mucous membranes.   ED Results / Procedures / Treatments    Labs (all labs ordered are listed, but only abnormal results are displayed) Labs Reviewed  COMPREHENSIVE METABOLIC PANEL - Abnormal; Notable for the following components:      Result Value   Sodium 130 (*)    Potassium 3.4 (*)    Chloride 97 (*)    Glucose, Bld 150 (*)    Albumin 3.3 (*)    All other components within normal limits  CBC - Abnormal; Notable for the following components:   WBC 11.8 (*)    RBC 3.58 (*)    Hemoglobin 10.1 (*)    HCT 32.5 (*)    Platelets 432 (*)    All other components within normal limits  URINALYSIS, ROUTINE W REFLEX MICROSCOPIC - Abnormal; Notable for the following components:   Color, Urine AMBER (*)    APPearance HAZY (*)    Ketones, ur 5 (*)    Leukocytes,Ua TRACE (*)    All other components within normal limits  GLUCOSE, CAPILLARY - Abnormal; Notable for the following components:   Glucose-Capillary 125 (*)    All other components within normal limits  LIPASE, BLOOD  APTT  PROTIME-INR  HEPARIN LEVEL (UNFRACTIONATED)  CBC  COMPREHENSIVE METABOLIC PANEL  MAGNESIUM  CBC WITH DIFFERENTIAL/PLATELET  OCCULT BLOOD X 1 CARD TO LAB, STOOL  IRON AND TIBC  FOLATE  VITAMIN B12  HEMOGLOBIN A1C     EKG    RADIOLOGY CT A/P: New retroperitoneal masses c/f metastatic disease, with bulky LAD, occlusion of IVC causing thrombosis of gonadal vein   I also independently reviewed and agree with radiologist interpretations.   PROCEDURES:  Critical Care performed: Yes, see critical care procedure note(s)  .Critical Care  Performed by: Duffy Bruce, MD Authorized by: Duffy Bruce, MD   Critical care provider statement:    Critical care time (minutes):  30   Critical care time was exclusive of:  Separately billable procedures and treating other patients   Critical care was necessary to treat or prevent imminent or life-threatening deterioration of the following conditions:  Circulatory failure, cardiac failure and respiratory  failure   Critical care was time spent personally by me on the following activities:  Development of treatment plan with patient or surrogate, discussions with consultants, evaluation of patient's response to treatment, examination of patient, ordering and review of laboratory studies, ordering and review of radiographic studies, ordering and performing treatments and interventions, pulse oximetry, re-evaluation of patient's condition and review of old charts   I assumed direction of critical care for this patient from another provider in my specialty: no     Care discussed with: admitting provider       [place] ED: Medications  heparin ADULT infusion 100 units/mL (25000 units/283m) (1,000 Units/hr Intravenous New Bag/Given 08/31/21 1946)  atorvastatin (LIPITOR) tablet 40 mg (has no administration in time range)  fluticasone (FLONASE) 50 MCG/ACT nasal spray 1 spray (has no administration in time range)  metoprolol tartrate (LOPRESSOR) tablet 50 mg (has no administration in time range)  quinapril (ACCUPRIL) tablet 40 mg (has no administration in time range)  hydrALAZINE (APRESOLINE) injection 10 mg (has no administration in time range)  insulin aspart (novoLOG) injection 0-15 Units (has no administration in time range)  sodium chloride flush (NS) 0.9 % injection 3 mL (has no administration in time range)  acetaminophen (TYLENOL) tablet 650 mg (has no administration in time range)    Or  acetaminophen (TYLENOL) suppository 650 mg (has no administration in time range)  polyethylene glycol (MIRALAX / GLYCOLAX) packet 17 g (has no administration in time range)  ondansetron (ZOFRAN) tablet 4 mg (has no administration in time range)    Or  ondansetron (ZOFRAN) injection 4 mg (has no administration in time range)  melatonin tablet 10 mg (has no administration in time range)  potassium chloride (KLOR-CON) packet 40 mEq (has no administration in time range)  lactated ringers infusion  (has no administration in time range)  oxyCODONE-acetaminophen (PERCOCET/ROXICET) 5-325 MG per tablet 1 tablet (has no administration in time range)    Or  morphine (PF) 4 MG/ML injection 4 mg (has no administration in time range)  sodium chloride 0.9 % bolus 1,000 mL (0 mLs Intravenous Stopped 08/31/21 1932)  iohexol (OMNIPAQUE) 300 MG/ML solution 80 mL (80 mLs Intravenous Contrast Given 08/31/21 1630)  heparin bolus via infusion 3,600 Units (3,600 Units Intravenous Bolus from Bag 08/31/21 1946)     IMPRESSION / MDM / [place]/ ED COURSE  I reviewed the triage vital signs and the nursing notes.  The patient is on the cardiac monitor to evaluate for evidence of arrhythmia and/or significant heart rate changes.   Ddx:  Differential includes the following, with pertinent life- or limb-threatening emergencies considered:  Fecal impaction, proctitis, UTI, bladder outlet obstruction, pelvic mass, diverticulitis, MSK pain, lumbosacral radiculopathy.  Patient's presentation is most consistent with acute presentation with potential threat to life or bodily function.  MDM:  81 yo F with PMHx CHF, DM, HTN, HLD, here with lower pelvic pain. Initially, concern for possible fecal impaction w/ urinary retention vs UTI. Lab work sent and reviewed. UA shows mild dehydration but no UTI. CMP unremarkable. Normal LFTs. CBC shows mild leukocytosis, baseline chronic anemia. CT scan obtained, reviewed, and unfortunately shows significant likely metastatic disease with psoas masses, adenopathy, IVC compression and gonadal vein thrombosis. I discussed this at length with pt and her daughters at bedside. Apparently, pt was told after hysterectomy for uterine prolapse in 2021 that she had "some kind of cancer," but did not follow up. Pt has been losing weight recently.  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 Discussed CT findings with Dr.  Trula Slade of Vascular Surgery, who does not feel surgical intervention would be beneficial at this time with regards to the IVC/compression. IV heparin gtt started. Discussed with Dr. Tasia Catchings of Oncology who will see pt as inpatient. Dr. Sidney Ace to admit.   MEDICATIONS GIVEN IN ED: Medications  heparin ADULT infusion 100 units/mL (25000 units/230m) (1,000 Units/hr Intravenous New Bag/Given 08/31/21 1946)  atorvastatin (LIPITOR) tablet 40 mg (has no administration in time range)  fluticasone (FLONASE) 50 MCG/ACT nasal spray 1 spray (has no administration in time range)  metoprolol tartrate (LOPRESSOR) tablet 50 mg (has no administration in time range)  quinapril (ACCUPRIL) tablet 40 mg (has no administration in time range)  hydrALAZINE (APRESOLINE) injection 10 mg (has no administration in time range)  insulin aspart (novoLOG) injection 0-15 Units (has no administration in time range)  sodium chloride flush (NS) 0.9 % injection 3 mL (has no administration in time range)  acetaminophen (TYLENOL) tablet 650 mg (has no administration in time range)    Or  acetaminophen (TYLENOL) suppository 650 mg (has no administration in time range)  polyethylene glycol (MIRALAX / GLYCOLAX) packet 17 g (has no administration in time range)  ondansetron (ZOFRAN) tablet 4 mg (has no administration in time range)    Or  ondansetron (ZOFRAN) injection 4 mg (has no administration in time range)  melatonin tablet 10 mg (has no administration in time range)  potassium chloride (KLOR-CON) packet 40 mEq (has no administration in time range)  lactated ringers infusion (has no administration in time range)  oxyCODONE-acetaminophen (PERCOCET/ROXICET) 5-325 MG per tablet 1 tablet (has no administration in time range)    Or  morphine (PF) 4 MG/ML injection 4 mg (has no administration in time range)  sodium chloride 0.9 % bolus 1,000 mL (0 mLs Intravenous Stopped 08/31/21 1932)  iohexol (OMNIPAQUE) 300 MG/ML solution 80 mL (80 mLs  Intravenous Contrast Given 08/31/21 1630)  heparin bolus via infusion 3,600 Units (3,600 Units Intravenous Bolus from Bag 08/31/21 1946)     Consults:  Dr. MSidney Acehospitalist Dr. YTasia CatchingsOncology Dr. 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   Duffy Bruce, MD 08/31/21 2227

## 2021-08-31 NOTE — Assessment & Plan Note (Signed)
Treated with sliding scale NovoLog.   Home regimen was held, resumed at discharge.  Primary care follow-up

## 2021-08-31 NOTE — Assessment & Plan Note (Signed)
   Patient exhibiting mild hyponatremia, likely secondary to relative volume depletion  Hydrating with intravenous isotonic fluids  Monitoring sodium levels with serial chemistries

## 2021-08-31 NOTE — Assessment & Plan Note (Signed)
   Notable substantial anemia  No clinical evidence of active bleeding  We will obtain iron panel, folate, vitamin B12  Monitoring hemoglobin and hematocrit with serial CBCs

## 2021-08-31 NOTE — ED Triage Notes (Signed)
Pt via POV from home. Pt c/o dysuria, burning with urination, back pain, and lower abd pain for the past couple of weeks. Pt was started on antibiotics on last Friday and it has not helped. Denies fevers. Pt is A&Ox4 and NAD.

## 2021-08-31 NOTE — Assessment & Plan Note (Signed)
·   Replacing with potassium chloride °· Evaluating for concurrent hypomagnesemia  °· Monitoring potassium levels with serial chemistries. ° °

## 2021-08-31 NOTE — ED Notes (Signed)
Pt to ED with family for dysuria since several weeks.

## 2021-08-31 NOTE — ED Notes (Signed)
Attempted IV one time, pt has sclerotic vein that rolled and blew. Will have other RN try.

## 2021-08-31 NOTE — H&P (Signed)
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  Patient explains that for the past several months she has been experiencing lower abdominal discomfort.  Patient describes his abdominal discomfort as moderate to severe in intensity, sharp and is occasionally cramping in quality.  Pain has been exacerbated by attempts to move her bowels or urinating.  Patient was attributing it to constipation and attempted to manage herself over the course of several weeks but symptoms continue to worsen.  Patient also complains of a lack of appetite, poor oral intake and gradual unintentional weight loss over this period of time.  Patient denies any associated fevers, sick contacts, recent ingestion of undercooked food or recent travel.  Of note, patient states that she has not had any oncology follow up since her diagnosis of endometrial adenocarcinoma and surgical resection in 02/2020.    Patient's discomfort continued to worsen and she eventually saw her primary care provider who placed on a course of Macrobid.  Despite taking this medications patient's symptoms did not improve.  Due to progressively worsening symptoms the patient eventually presented to [facility] emergency department for evaluation.  Upon evaluation in the emergency department CT imaging of the abdomen and pelvis performed revealed new retroperitoneal masses involving the left iliopsoas muscle and right presacral space with  evidence of osteolysis of the right anterior upper sacrum with concern for recurrent malignancy/metastatic disease with concurrent occlusion of the IVC causing acute thrombosis of the right gonadal vein.  Case discussed with Vascular Surgery initially who stated that with patient's current tumor burden there would be no surgical intervention, recommended anticoagulation and oncology involvement.  ER provider then discussed case with Dr. Tasia Catchings with Oncology who agreed to consult on patient in the morning.  Patient was had initiated on heparin infusion and the hospitalist group was then called to assess the patient for admission to the hospital.  Review of Systems: Review of Systems  Gastrointestinal:  Positive for abdominal pain and constipation.  Genitourinary:  Positive for dysuria.     Past Medical History:  Diagnosis Date   Arthritis    CHF (congestive heart failure) (HCC)    Diabetes mellitus without complication (HCC)    HLD (hyperlipidemia)    Hypertension     Past Surgical History:  Procedure Laterality Date   VENTRAL HERNIA REPAIR N/A 04/30/2019   Procedure: HERNIA REPAIR VENTRAL ADULT;  Surgeon: Jules Husbands, MD;  Location: [facility] ORS;  Service: General;  Laterality: N/A;    Social History:  reports that she has never smoked. She has never used smokeless tobacco. She reports that she does not drink alcohol and does not use drugs.  Allergies  Allergen Reactions   Penicillins Nausea And Vomiting    Family History  Problem Relation Age of Onset   Endometrial cancer Neg Hx     Prior to Admission medications   Medication Sig Start Date End Date Taking? Authorizing Provider  aspirin EC 81 MG tablet Take 81 mg by mouth daily.    [provider]  atorvastatin (LIPITOR) 40 MG tablet Take 40 mg by mouth daily.    [provider]  canagliflozin (INVOKANA) 100 MG TABS tablet Take 100 mg by mouth daily before breakfast.    [provider]  fluticasone  (FLONASE) 50 MCG/ACT nasal spray Place 1 spray into both nostrils daily.    [provider]  ibuprofen (ADVIL) 800 MG tablet Take 1 tablet (800 mg total) by mouth every 8 (eight) hours as needed. 05/04/19   Tylene Fantasia, PA-C  metoprolol tartrate (LOPRESSOR) 50 MG tablet Take 50 mg by mouth 2 (two) times daily.    [provider]  ondansetron (ZOFRAN-ODT) 4 MG disintegrating tablet Take 1 tablet (4 mg total) by mouth every 8 (eight) hours as needed for nausea or vomiting. 05/05/19   Pabon, Diego F, MD  quinapril (ACCUPRIL) 40 MG tablet Take 40 mg by mouth 2 (two) times daily.    [provider]    Physical Exam:  Vitals:   08/31/21 1730 08/31/21 1935 08/31/21 2000 08/31/21 2101  BP:  (!) 175/83  (!) 193/85  Pulse: 73 82  86  Resp:  18  18  Temp:    98.3 F (36.8 C)  TempSrc:      SpO2: 99% 97%  100%  Weight:   63.4 kg   Height:   '5\' 2"'$  (1.575 m)     Constitutional: Awake alert and oriented x3, no associated distress.   Skin: no rashes, no lesions, poor skin turgor noted. Eyes: Pupils are equally reactive to light.  No evidence of scleral icterus or conjunctival pallor.  ENMT: Dry mucous membranes noted.  Posterior pharynx clear of any exudate or lesions.   Neck: normal, supple, no masses, no thyromegaly.  No evidence of jugular venous distension.   Respiratory: clear to auscultation bilaterally, no wheezing, no crackles. Normal respiratory effort. No accessory muscle use.  Cardiovascular: Regular rate and rhythm, no murmurs / rubs / gallops. No extremity edema. 2+ pedal pulses. No carotid bruits.  Chest:   Nontender without crepitus or deformity.   Back:   Nontender without crepitus or deformity. Abdomen: Notable lower abdominal tenderness.  Abdomen is soft.  Notable vague lower abdominal fullness palpated.  No evidence of intra-abdominal masses.  Positive bowel sounds noted in all quadrants.   Musculoskeletal: No joint deformity upper and lower  extremities. Good ROM, no contractures. Normal muscle tone.  Neurologic: CN 2-12 grossly intact. Sensation intact.  Patient moving all 4 extremities spontaneously.  Patient is following all commands.  Patient is responsive to verbal stimuli.   Psychiatric: Patient exhibits normal mood with appropriate affect.  Patient seems to possess insight as to their current situation.    Data Reviewed:  I have personally reviewed and interpreted labs, imaging.  Significant findings are:  Chemistry revealing sodium 130, potassium 3.4, chloride 97, glucose 150, albumin 3.3.   CBC revealing white blood cell count 11.8, hemoglobin 10.1, hematocrit 32.5, platelet count 432.   Coagulation profile revealing INR 1.1, PT of 13.9, PTT of 32.   Urinalysis revealing 0-5 white blood cells per high-powered field, trace leukocytes, negative nitrate and no evidence of bacteria.   Assessment and Plan: * Adenocarcinoma of endometrium [facility]) Patient presenting with several week history of progressively worsening constipation and abdominal discomfort Patient has a known history of adenocarcinoma of the endometrium status post hysterectomy 02/2020 Patient admits to having no oncologic follow up since her diagnosis and resection.   CT consistent with likely recurrence of cancer with  metastatic spread and gonadal vein thrombosis as a result.   We will manage patient's symptoms with as needed analgesics and antiemetics for now ER provider discussed case with Dr. Tasia Catchings with oncology who agreed to see patient in consultation in the morning  Patient will additionally need gynecology oncology evaluation during this hospitalization.  Appreciate oncology's assistance in helping to arrange this Next steps on further evaluation and management pending oncology recommendations  Deep venous thrombosis ([place]) Notable acute thrombosis of the right gonadal vein seen on CT imaging of the abdomen and pelvis likely secondary to metastatic  disease burden ER provider discussed case with vascular surgery and due to the patient significant tumor burden they recommended no surgical intervention from their standpoint at this time and recommended initiation of heparin Continuing heparin for now, awaiting oncology evaluation in the morning Patient will eventually need to be transitioned to either an oral anticoagulant or Lovenox prior to discharge  Hyponatremia Patient exhibiting mild hyponatremia, likely secondary to relative volume depletion Hydrating with intravenous isotonic fluids Monitoring sodium levels with serial chemistries  Hypokalemia Replacing with potassium chloride Evaluating for concurrent hypomagnesemia  Monitoring potassium levels with serial chemistries.   Type 2 diabetes mellitus with hyperglycemia, without long-term current use of insulin (HCC) Patient been placed on Accu-Cheks before every meal and nightly with sliding scale insulin Holding home regimen of oral hypoglycemics Hemoglobin A1C ordered Diabetic Diet   Normocytic anemia Notable substantial anemia No clinical evidence of active bleeding We will obtain iron panel, folate, vitamin B12 Monitoring hemoglobin and hematocrit with serial CBCs   Essential hypertension Resume patients home regimen of oral antihypertensives Titrate antihypertensive regimen as necessary to achieve adequate BP control PRN intravenous antihypertensives for excessively elevated blood pressure    Mixed diabetic hyperlipidemia associated with type 2 diabetes mellitus ([place]) Continuing home regimen of lipid lowering therapy.        Code Status:  DNR  code status decision has been confirmed with: patient Family Communication: Daughter is at the bedside who has been updated on plan of care.    Consults: Dr. Tasia Catchings with Oncology - will see in AM.    Severity of Illness:  The appropriate patient status for this patient is INPATIENT. Inpatient status is judged to be  reasonable and necessary in order to provide the required intensity of service to ensure the patient's safety. The patient's presenting symptoms, physical exam findings, and initial radiographic and laboratory data in the context of their chronic comorbidities is felt to place them at high risk for further clinical deterioration. Furthermore, it is not anticipated that the patient will be medically stable for discharge from the hospital within 2 midnights of admission.   * I certify that at the point of admission it is my clinical judgment that the patient will require inpatient hospital care spanning beyond 2 midnights from the point of admission due to high intensity of service, high risk for further deterioration and high frequency of surveillance required.*  Author:  Vernelle Emerald MD  08/31/2021 11:27 PM

## 2021-08-31 NOTE — Assessment & Plan Note (Addendum)
   Patient presenting with several week history of progressively worsening constipation and abdominal discomfort  Patient has a known history of adenocarcinoma of the endometrium status post hysterectomy 02/2020  Patient admits to having no oncologic follow up since her diagnosis and resection.    CT consistent with likely recurrence of cancer with metastatic spread and gonadal vein thrombosis as a result.    We will manage patient's symptoms with as needed analgesics and antiemetics for now  ER provider discussed case with Dr. Tasia Catchings with oncology who agreed to see patient in consultation in the morning   Patient will additionally need gynecology oncology evaluation during this hospitalization.  Appreciate oncology's assistance in helping to arrange this  Next steps on further evaluation and management pending oncology recommendations

## 2021-09-01 DIAGNOSIS — C772 Secondary and unspecified malignant neoplasm of intra-abdominal lymph nodes: Secondary | ICD-10-CM

## 2021-09-01 DIAGNOSIS — I82890 Acute embolism and thrombosis of other specified veins: Secondary | ICD-10-CM | POA: Diagnosis not present

## 2021-09-01 DIAGNOSIS — C541 Malignant neoplasm of endometrium: Secondary | ICD-10-CM | POA: Diagnosis not present

## 2021-09-01 LAB — FERRITIN: Ferritin: 71 ng/mL (ref 11–307)

## 2021-09-01 LAB — GLUCOSE, CAPILLARY
Glucose-Capillary: 142 mg/dL — ABNORMAL HIGH (ref 70–99)
Glucose-Capillary: 108 mg/dL — ABNORMAL HIGH (ref 70–99)
Glucose-Capillary: 105 mg/dL — ABNORMAL HIGH (ref 70–99)

## 2021-09-01 LAB — RETIC PANEL
Immature Retic Fract: 18.6 % — ABNORMAL HIGH (ref 2.3–15.9)
RBC.: 3.4 MIL/uL — ABNORMAL LOW (ref 3.87–5.11)
Retic Count, Absolute: 77.5 10*3/uL (ref 19.0–186.0)
Retic Ct Pct: 2.3 % (ref 0.4–3.1)
Reticulocyte Hemoglobin: 30.2 pg (ref >27.9)

## 2021-09-01 LAB — COMPREHENSIVE METABOLIC PANEL WITH GFR
ALT: 7 U/L (ref 0–44)
AST: 14 U/L — ABNORMAL LOW (ref 15–41)
Albumin: 2.5 g/dL — ABNORMAL LOW (ref 3.5–5.0)
Alkaline Phosphatase: 53 U/L (ref 38–126)
Anion gap: 8 (ref 5–15)
BUN: 8 mg/dL (ref 8–23)
CO2: 25 mmol/L (ref 22–32)
Calcium: 8.7 mg/dL — ABNORMAL LOW (ref 8.9–10.3)
Chloride: 102 mmol/L (ref 98–111)
Creatinine, Ser: 0.46 mg/dL (ref 0.44–1.00)
GFR, Estimated: >60 mL/min
Glucose, Bld: 96 mg/dL (ref 70–99)
Potassium: 3.7 mmol/L (ref 3.5–5.1)
Sodium: 135 mmol/L (ref 135–145)
Total Bilirubin: 0.7 mg/dL (ref 0.3–1.2)
Total Protein: 6.6 g/dL (ref 6.5–8.1)

## 2021-09-01 LAB — CBC WITH DIFFERENTIAL/PLATELET
Abs Immature Granulocytes: 0.05 K/uL (ref 0.00–0.07)
Basophils Absolute: 0.1 K/uL (ref 0.0–0.1)
Basophils Relative: 1 %
Eosinophils Absolute: 0.2 K/uL (ref 0.0–0.5)
Eosinophils Relative: 2 %
HCT: 27.9 % — ABNORMAL LOW (ref 36.0–46.0)
Hemoglobin: 8.8 g/dL — ABNORMAL LOW (ref 12.0–15.0)
Immature Granulocytes: 1 %
Lymphocytes Relative: 14 %
Lymphs Abs: 1.4 K/uL (ref 0.7–4.0)
MCH: 28 pg (ref 26.0–34.0)
MCHC: 31.5 g/dL (ref 30.0–36.0)
MCV: 88.9 fL (ref 80.0–100.0)
Monocytes Absolute: 0.8 K/uL (ref 0.1–1.0)
Monocytes Relative: 8 %
Neutro Abs: 7.8 K/uL — ABNORMAL HIGH (ref 1.7–7.7)
Neutrophils Relative %: 74 %
Platelets: 353 K/uL (ref 150–400)
RBC: 3.14 MIL/uL — ABNORMAL LOW (ref 3.87–5.11)
RDW: 13.8 % (ref 11.5–15.5)
WBC: 10.4 K/uL (ref 4.0–10.5)
nRBC: 0 % (ref 0.0–0.2)

## 2021-09-01 LAB — IRON AND TIBC
Iron: 20 ug/dL — ABNORMAL LOW (ref 28–170)
Saturation Ratios: 12 % (ref 10.4–31.8)
TIBC: 174 ug/dL — ABNORMAL LOW (ref 250–450)
UIBC: 154 ug/dL

## 2021-09-01 LAB — HEPARIN LEVEL (UNFRACTIONATED)
Heparin Unfractionated: 0.78 IU/mL — ABNORMAL HIGH (ref 0.30–0.70)
Heparin Unfractionated: 0.64 IU/mL (ref 0.30–0.70)
Heparin Unfractionated: 0.59 IU/mL (ref 0.30–0.70)

## 2021-09-01 LAB — VITAMIN B12: Vitamin B-12: 939 pg/mL — ABNORMAL HIGH (ref 180–914)

## 2021-09-01 LAB — FOLATE: Folate: 6.3 ng/mL (ref >5.9)

## 2021-09-01 LAB — MAGNESIUM: Magnesium: 1.8 mg/dL (ref 1.7–2.4)

## 2021-09-01 MED ORDER — LOSARTAN POTASSIUM 50 MG PO TABS
100.0000 mg | ORAL_TABLET | Freq: Every day | ORAL | Status: DC
  Administered 2021-09-02 – 2021-09-03 (×2): 100 mg via ORAL
  Filled 2021-09-01 (×3): qty 2

## 2021-09-01 NOTE — Progress Notes (Addendum)
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  She also reported poor appetite with reduced oral intake, gradual unintended weight loss.  She saw PCP who prescribed a course of Macrobid, patient noted no improvement in her symptoms, they progressively worsen despite this.  In the ED, CT of the abdomen and pelvis revealed new retroperitoneal masses involving the left iliopsoas muscle, right presacral space with evidence of osteolysis in the right anterior upper sacrum.  Findings are concerning for recurrence of endometrial adenocarcinoma with metastatic spread.  Scan also showed occlusion of the IVC causing acute thrombosis of the right gonadal vein.  She was started on IV heparin and vascular surgery consulted.  Oncology consulted for recommendations regarding metastatic malignancy.  Assessment and Plan: * Adenocarcinoma of endometrium [facility]) Presented with several week history of progressively worsening constipation and lower abdominal discomfort in the setting of known endometrial adenocarcinoma diagnosed in December 2021.  Patient had hysterectomy and has since not followed up with oncology.  CT findings with new retroperitoneal masses and gonadal vein thrombosis are concerning for metastatic disease. -- Oncology consulted appreciate recommendations -- Palliative care consulted -- Supportive care with pain control, antiemetics, laxatives for constipation -- Oncology recommends biopsy, patient and family deciding whether to pursue this   Deep venous thrombosis ([place]) Patient has thrombus of  the right gonadal vein likely secondary to malignancy. -- Continue IV heparin -- DOAC versus Lovenox at discharge, defer to oncology -- Vascular surgery was consulted on admission and patient felt to be poor candidate for procedural intervention given current tumor burden  Hyponatremia Sodium 130 on admission, likely due to poor p.o. intake and hypovolemia. Resolved with IV hydration. --Follow BMP -- Stop IV fluids  Hypokalemia K3.4 on admission was replaced. Resolved, K3.7 Monitor BMP replace K as needed   Type 2 diabetes mellitus with hyperglycemia, without long-term current use of insulin (HCC) Sliding scale NovoLog.  Home regimen is held.  Follow-up pending hemoglobin A1c.  Adjust insulin as needed for goal of 140-180  Normocytic anemia Hemoglobin 10.1 on admission Anemia panel reflects iron deficiency, normal folic acid, G26 level pending. -- Follow-up pending B12 level -- Follow CBC -- Given recent issues with constipation we will hold off oral iron for now  Hemoglobin today 8.8, likely dilutional, all cell lines are down since previous.   Essential hypertension Resumed on home antihypertensives -metoprolol and losartan. IV hydralazine as needed    Mixed diabetic hyperlipidemia associated with type 2 diabetes mellitus ([place]) Continue home statin   Acute deep vein thrombosis (DVT) of pelvic vein Thrombosis involving right gonadal vein in the setting of malignancy. 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 Awaiting patient decision regarding biopsy.  We will consult IR if she wishes to proceed.        Subjective: Patient awake resting in bed, daughter at bedside when seen on rounds today.  She reports feeling extremely uncomfortable with Foley catheter that was placed on admission.  She is asking for to be removed.  She  wants to go  home today and perseverates on this.  Discussed getting oncology's input and keeping her on IV blood thinner until we know if any procedures need to be done.  Per oncology, patient is thinking about discussing with family whether to pursue biopsy or not.  Physical Exam: Vitals:   08/31/21 2000 08/31/21 2101 09/01/21 0451 09/01/21 0802  BP:  (!) 193/85 (!) 161/73 (!) 152/65  Pulse:  86 72 77  Resp:  '18 17 18  '$ Temp:  98.3 F (36.8 C) 98.4 F (36.9 C) 97.8 F (36.6 C)  TempSrc:      SpO2:  100% 100% 100%  Weight: 63.4 kg     Height: '5\' 2"'$  (1.575 m)      General exam: awake, alert, no acute distress HEENT: atraumatic, clear conjunctiva, anicteric sclera, moist mucus membranes, hearing grossly normal  Respiratory system: CTAB, no wheezes, rales or rhonchi, normal respiratory effort. Cardiovascular system: normal S1/S2,  RRR, no pedal edema.   Gastrointestinal system: soft, nontender nondistended abdomen hypoactive bowel sounds Central nervous system: A&O x3. no gross focal neurologic deficits, normal speech Extremities: moves all, no edema, normal tone Skin: dry, intact, normal temperature Psychiatry: Anxious mood, congruent affect, judgement and insight appear normal   Data Reviewed:  Notable labs: Hyponatremia has resolved sodium 135, hypokalemia resolved potassium 3.7, calcium 8.7, albumin 2.5, AST 14, iron low 20, TIBC low 174, normal folate 6.3, leukocytosis resolved, hemoglobin went from 10.1 down to 8.8, thrombocytosis resolved  Family Communication: Daughter at bedside on rounds  Disposition: Status is: Inpatient Remains inpatient appropriate because: Ongoing evaluation not appropriate for the outpatient setting.  Remains on IV heparin for VTE treatment.    Planned Discharge Destination: Home    Time spent: 40 minutes  Author: Ezekiel Slocumb, DO 09/01/2021 2:01 PM  For on call review [email or web address].

## 2021-09-01 NOTE — Assessment & Plan Note (Addendum)
Thrombosis involving right gonadal vein in the setting of malignancy. -- treated with IV heparin -- Transition to Eliquis at d/c per oncology

## 2021-09-01 NOTE — Progress Notes (Signed)
ANTICOAGULATION CONSULT NOTE  Pharmacy Consult for Heparin Drip Indication: DVT  Allergies  Allergen Reactions   Penicillins Nausea And Vomiting    Patient Measurements: Height: '5\' 2"'$  (157.5 cm) Weight: 63.4 kg (139 lb 12.4 oz) IBW/kg (Calculated) : 50.1 Heparin Dosing Weight:    Vital Signs: Temp: 98.4 F (36.9 C) (07/02 0451) BP: 161/73 (07/02 0451) Pulse Rate: 72 (07/02 0451)  Labs: Recent Labs    08/31/21 1223 08/31/21 1932 09/01/21 0529  HGB 10.1*  --  8.8*  HCT 32.5*  --  27.9*  PLT 432*  --  353  APTT  --  32  --   LABPROT  --  13.9  --   INR  --  1.1  --   HEPARINUNFRC  --   --  0.78*  CREATININE 0.63  --   --      Estimated Creatinine Clearance: 49.1 mL/min (by C-G formula based on SCr of 0.63 mg/dL).   Medical History: Past Medical History:  Diagnosis Date   Arthritis    CHF (congestive heart failure) ([place])    Diabetes mellitus without complication (HCC)    HLD (hyperlipidemia)    Hypertension    Assessment: Patient is an 81yo female admitted for abdominal pain. 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 Pharmacy consulted for Heparin dosing.  Reviewed prior to admission medications and no anticoagulants noted.  Goal of Therapy:  Heparin level 0.3-0.7 units/ml Monitor platelets by anticoagulation protocol: Yes  7/02 0529 HL 0.78, supratherapeutic   Plan:  Decrease heparin infusion to 900 units/hr Recheck HL in 8 hrs after rate change CBC daily while on heparin  Renda Rolls, PharmD, [facility] 09/01/2021 6:51 AM

## 2021-09-01 NOTE — Hospital Course (Signed)
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  She also reported poor appetite with reduced oral intake, gradual unintended weight loss.  She saw PCP who prescribed a course of Macrobid, patient noted no improvement in her symptoms, they progressively worsen despite this.  In the ED, CT of the abdomen and pelvis revealed new retroperitoneal masses involving the left iliopsoas muscle, right presacral space with evidence of osteolysis in the right anterior upper sacrum.  Findings are concerning for recurrence of endometrial adenocarcinoma with metastatic spread.  Scan also showed occlusion of the IVC causing acute thrombosis of the right gonadal vein.  She was started on IV heparin and vascular surgery consulted.  Oncology consulted for recommendations regarding metastatic malignancy.

## 2021-09-01 NOTE — Progress Notes (Signed)
ANTICOAGULATION CONSULT NOTE  Pharmacy Consult for Heparin Drip Indication: DVT  Allergies  Allergen Reactions   Penicillins Nausea And Vomiting    Patient Measurements: Height: '5\' 2"'$  (157.5 cm) Weight: 63.4 kg (139 lb 12.4 oz) IBW/kg (Calculated) : 50.1 Heparin Dosing Weight:    Vital Signs: Temp: 97.8 F (36.6 C) (07/02 0802) BP: 152/65 (07/02 0802) Pulse Rate: 77 (07/02 0802)  Labs: Recent Labs    08/31/21 1223 08/31/21 1932 09/01/21 0529 09/01/21 1446  HGB 10.1*  --  8.8*  --   HCT 32.5*  --  27.9*  --   PLT 432*  --  353  --   APTT  --  32  --   --   LABPROT  --  13.9  --   --   INR  --  1.1  --   --   HEPARINUNFRC  --   --  0.78* 0.64  CREATININE 0.63  --  0.46  --      Estimated Creatinine Clearance: 49.1 mL/min (by C-G formula based on SCr of 0.46 mg/dL).   Medical History: Past Medical History:  Diagnosis Date   Arthritis    CHF (congestive heart failure) ([place])    Diabetes mellitus without complication (HCC)    HLD (hyperlipidemia)    Hypertension    Assessment: Patient is an 81yo female admitted for abdominal pain. 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 Pharmacy consulted for Heparin dosing.  Reviewed prior to admission medications and no anticoagulants noted.  Goal of Therapy:  Heparin level 0.3-0.7 units/ml Monitor platelets by anticoagulation protocol: Yes  7/02 0529 HL 0.78, supratherapeutic 7/02 1446 HL 0.64  therapeutic x1   Plan:  7/02 1446 HL 0.64  therapeuticx1 continue heparin infusion at 900 units/hr check confirmatory HL in 8 hrs CBC daily while on heparin  Chinita Greenland PharmD Clinical Pharmacist 09/01/2021

## 2021-09-01 NOTE — Consult Note (Addendum)
Hematology/Oncology Consult note [phone] [phone]      Patient Care Team: Wynonia Sours, PA as PCP - General (Oncology)   Name of the patient: Brenda Bender  [id number]  10-21-1940   Date of visit: 09/01/21 REASON FOR COSULTATION:  Metastatic endometrial cancer History of presenting illness-  81 y.o. female with PMH listed at below who presents to ER for evaluation of dysuria and lower abdominal pain.  She has noticed that abdominal discomfort for the past several months.   UA showed trace leukocytes.  Negative nitrates. Patient has a history of  status post resection on 02/20/2020. Pathology showed endometrioid adenocarcinoma, FIGO grade 2, invasive 9 mm of 28 mm endometrial thickness [22%].pT1a pNx .  She did not follow-up after surgery.  Patient lives by herself in [place].  She was independent with her ADLs.  Her daughter and sons are at bedside today.She has lost weight.  Appetite is not good. 08/31/2021, CT abdomen pelvis with contrast showed new retroperitoneal mass involving the left iliopsoas muscle and right presacral space, causing osteolysis of the right anterior upper sacrum.  New abdominal region paratonia adenopathy.  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  Stable bilateral hydronephrosis. Patient has been started on heparin drip.  Currently she denies any pain.  .Review of Systems  Constitutional:  Positive for appetite change, fatigue and unexpected weight change. Negative for chills and fever.  HENT:   Negative for hearing loss and voice change.   Eyes:  Negative for eye problems.  Respiratory:  Negative for chest tightness and cough.   Cardiovascular:  Negative for chest pain.  Gastrointestinal:  Positive for abdominal pain. Negative for abdominal distention and blood in stool.  Endocrine: Negative for hot flashes.  Genitourinary:  Negative for difficulty urinating and frequency.    Musculoskeletal:  Negative for arthralgias.  Skin:  Negative for itching and rash.  Neurological:  Negative for extremity weakness.  Hematological:  Negative for adenopathy.  Psychiatric/Behavioral:  Negative for confusion.     Allergies  Allergen Reactions   Penicillins Nausea And Vomiting    Patient Active Problem List   Diagnosis Date Noted   Adenocarcinoma of endometrium ([place]) 08/31/2021   Essential hypertension 08/31/2021   Diabetes mellitus without complication ([place]) 27/25/3664   Deep venous thrombosis ([place]) 08/31/2021   Hyponatremia 08/31/2021   Hypokalemia 08/31/2021   Type 2 diabetes mellitus with hyperglycemia, without long-term current use of insulin ([place]) 08/31/2021   Normocytic anemia 08/31/2021   Mixed diabetic hyperlipidemia associated with type 2 diabetes mellitus ([place]) 08/31/2021   Prolapsed, uterovaginal, complete 02/20/2020   Mixed hyperlipidemia 09/06/2019   Seasonal allergic rhinitis due to pollen 09/06/2019   Incarcerated ventral hernia 04/30/2019     Past Medical History:  Diagnosis Date   Arthritis    CHF (congestive heart failure) ([place])    Diabetes mellitus without complication ([place])    HLD (hyperlipidemia)    Hypertension      Past Surgical History:  Procedure Laterality Date   VENTRAL HERNIA REPAIR N/A 04/30/2019   Procedure: HERNIA REPAIR VENTRAL ADULT;  Surgeon: Jules Husbands, MD;  Location: [facility] ORS;  Service: General;  Laterality: N/A;    Social History   Socioeconomic History   Marital status: Widowed    Spouse name: Not on file   Number of children: Not on file   Years of education: Not on file   Highest education level: Not on file  Occupational History  Not on file  Tobacco Use   Smoking status: Never   Smokeless tobacco: Never  Substance and Sexual Activity   Alcohol use: No   Drug use: No   Sexual activity: Not Currently  Other Topics Concern   Not on file  Social History Narrative   Not on file   Social  Determinants of Health   Financial Resource Strain: Not on file  Food Insecurity: Not on file  Transportation Needs: Not on file  Physical Activity: Not on file  Stress: Not on file  Social Connections: Not on file  Intimate Partner Violence: Not on file     Family History  Problem Relation Age of Onset   Endometrial cancer Neg Hx      Current Facility-Administered Medications:    acetaminophen (TYLENOL) tablet 650 mg, 650 mg, Oral, Q6H PRN **OR** acetaminophen (TYLENOL) suppository 650 mg, 650 mg, Rectal, Q6H PRN, Shalhoub, Sherryll Burger, MD   atorvastatin (LIPITOR) tablet 40 mg, 40 mg, Oral, Daily, Shalhoub, Sherryll Burger, MD   fluticasone (FLONASE) 50 MCG/ACT nasal spray 1 spray, 1 spray, Each Nare, Daily PRN, Shalhoub, Sherryll Burger, MD   heparin ADULT infusion 100 units/mL (25000 units/257m), 900 Units/hr, Intravenous, Continuous, Belue, NAlver Sorrow RPH, Last Rate: 9 mL/hr at 09/01/21 0715, 900 Units/hr at 09/01/21 0715   hydrALAZINE (APRESOLINE) injection 10 mg, 10 mg, Intravenous, Q6H PRN, Shalhoub, GSherryll Burger MD   insulin aspart (novoLOG) injection 0-15 Units, 0-15 Units, Subcutaneous, TID AC & HS, Shalhoub, GSherryll Burger MD, 2 Units at 09/01/21 02992  lactated ringers infusion, , Intravenous, Continuous, GEzekiel Slocumb DO, Last Rate: 125 mL/hr at 08/31/21 2243, New Bag at 08/31/21 2243   melatonin tablet 10 mg, 10 mg, Oral, QHS PRN, Shalhoub, GSherryll Burger MD   metoprolol tartrate (LOPRESSOR) tablet 50 mg, 50 mg, Oral, BID, Shalhoub, GSherryll Burger MD, 50 mg at 08/31/21 2226   oxyCODONE-acetaminophen (PERCOCET/ROXICET) 5-325 MG per tablet 1 tablet, 1 tablet, Oral, Q4H PRN, 1 tablet at 08/31/21 2226 **OR** morphine (PF) 4 MG/ML injection 4 mg, 4 mg, Intravenous, Q4H PRN, Shalhoub, GSherryll Burger MD   ondansetron (ZOFRAN) tablet 4 mg, 4 mg, Oral, Q6H PRN **OR** ondansetron (ZOFRAN) injection 4 mg, 4 mg, Intravenous, Q6H PRN, Shalhoub, GSherryll Burger MD   polyethylene glycol (MIRALAX / GLYCOLAX) packet 17 g, 17 g,  Oral, Daily PRN, Shalhoub, GSherryll Burger MD   quinapril (ACCUPRIL) tablet 40 mg, 40 mg, Oral, BID, Shalhoub, GSherryll Burger MD   sodium chloride flush (NS) 0.9 % injection 3 mL, 3 mL, Intravenous, Q12H, Shalhoub, GSherryll Burger MD, 3 mL at 08/31/21 2230   Physical exam:  Vitals:   08/31/21 2000 08/31/21 2101 09/01/21 0451 09/01/21 0802  BP:  (!) 193/85 (!) 161/73 (!) 152/65  Pulse:  86 72 77  Resp:  '18 17 18  '$ Temp:  98.3 F (36.8 C) 98.4 F (36.9 C) 97.8 F (36.6 C)  TempSrc:      SpO2:  100% 100% 100%  Weight: 139 lb 12.4 oz (63.4 kg)     Height: '5\' 2"'$  (1.575 m)      Physical Exam Constitutional:      General: She is not in acute distress.    Comments: Frail appearance elderly female lying in the bed.  HENT:     Head: Normocephalic and atraumatic.     Nose: Nose normal.     Mouth/Throat:     Pharynx: No oropharyngeal exudate.  Eyes:     General: No scleral icterus.  Pupils: Pupils are equal, round, and reactive to light.  Cardiovascular:     Rate and Rhythm: Normal rate and regular rhythm.     Heart sounds: No murmur heard. Pulmonary:     Effort: Pulmonary effort is normal. No respiratory distress.     Breath sounds: No rales.  Chest:     Chest wall: No tenderness.  Abdominal:     General: There is no distension.     Palpations: Abdomen is soft.     Tenderness: There is no abdominal tenderness.  Musculoskeletal:        General: Normal range of motion.     Cervical back: Normal range of motion and neck supple.  Skin:    General: Skin is warm and dry.     Findings: No erythema.  Neurological:     Mental Status: She is alert and oriented to person, place, and time.     Cranial Nerves: No cranial nerve deficit.     Motor: No abnormal muscle tone.     Coordination: Coordination normal.  Psychiatric:        Mood and Affect: Mood and affect normal.            Latest Ref Rng & Units 09/01/2021    5:29 AM  CMP  Glucose 70 - 99 mg/dL 96   BUN 8 - 23 mg/dL 8   Creatinine  0.44 - 1.00 mg/dL 0.46   Sodium 135 - 145 mmol/L 135   Potassium 3.5 - 5.1 mmol/L 3.7   Chloride 98 - 111 mmol/L 102   CO2 22 - 32 mmol/L 25   Calcium 8.9 - 10.3 mg/dL 8.7   Total Protein 6.5 - 8.1 g/dL 6.6   Total Bilirubin 0.3 - 1.2 mg/dL 0.7   Alkaline Phos 38 - 126 U/L 53   AST 15 - 41 U/L 14   ALT 0 - 44 U/L 7       Latest Ref Rng & Units 09/01/2021    5:29 AM  CBC  WBC 4.0 - 10.5 K/uL 10.4   Hemoglobin 12.0 - 15.0 g/dL 8.8   Hematocrit 36.0 - 46.0 % 27.9   Platelets 150 - 400 K/uL 353     RADIOGRAPHIC STUDIES: I have personally reviewed the radiological images as listed and agreed with the findings in the report. CT ABDOMEN PELVIS W CONTRAST  Result Date: 08/31/2021 CLINICAL DATA:  Abdominal pain for several weeks.  Dysuria. EXAM: CT ABDOMEN AND PELVIS WITH CONTRAST TECHNIQUE: Multidetector CT imaging of the abdomen and pelvis was performed using the standard protocol following bolus administration of intravenous contrast. RADIATION DOSE REDUCTION: This exam was performed according to the departmental dose-optimization program which includes automated exposure control, adjustment of the mA and/or kV according to patient size and/or use of iterative reconstruction technique. CONTRAST:  33m OMNIPAQUE IOHEXOL 300 MG/ML  SOLN COMPARISON:  04/30/2019 FINDINGS: Lower Chest: No acute findings. Hepatobiliary: Sub-centimeter low-attenuation lesion in the central left hepatic lobe is too small to characterize, but stable consistent benign etiology. No new or enlarging liver lesions identified. Gallbladder is unremarkable. No evidence of biliary ductal dilatation. Pancreas:  No mass or inflammatory changes. Spleen: Within normal limits in size and appearance. Adrenals/Urinary Tract: Normal adrenal glands few benign-appearing cysts stable. Renal masses identified bilateral hydronephrosis shows significant change. Unremarkable unopacified urinary bladder. Stomach/Bowel: No evidence of obstruction,  inflammatory process or abnormal fluid collections. Vascular/Lymphatic: Abdominal retroperitoneal lymphadenopathy is seen is increased since previous study. Adenopathy in the aortocaval space  measures 3.8 x 3.9 cm and is new since previous study. This encases and obstructs the IVC, and causes acute thrombosis of the right gonadal vein. Largest area of lymphadenopathy in the left paraaortic region measures 2.8 x 2.2 cm slightly increased since previous study. A new heterogeneously enhancing mass is seen involving left iliopsoas muscle encasing left external iliac vessels. This measures 7.5 x 7.3 cm on image 50/2. A new retroperitoneal mass is seen in the right presacral space which encases the right internal iliac vessels and causes osteolysis of right anterior upper sacrum. This measures 6.1 x 3.6 cm on image 55/2. Reproductive: Prior hysterectomy noted. Adnexal regions are unremarkable in appearance. Other:  None. Musculoskeletal: Osteolysis is seen involving the right anterior upper sacrum of the presacral mass described above. No other suspicious bone lesions identified. IMPRESSION: New retroperitoneal masses involving the left iliopsoas muscle and right presacral space, causing osteolysis of the right anterior upper sacrum. This is likely due to metastatic disease. Metastatic disease. New abdominal retroperitoneal lymphadenopathy, also likely to metastatic disease. This occludes the IVC, causing acute thrombosis of the right gonadal vein. Stable bilateral hydronephrosis. Electronically Signed   By: Marlaine Hind M.D.   On: 08/31/2021 17:32    Assessment and plan  #New retroperitoneal mass/lymphadenopathy, history of FIGO 2 endometrioid adenocarcinoma status resection Imaging findings were reviewed and discussed with patient.  Recommend CT-guided biopsy to establish tissue diagnosis. Patient is very frail and has malnutrition.  She is undecided whether she is interested in eating further treatments if  malignancy diagnosis is diagnosed.  Encourage patient to further discuss with her family members and then decide.  Recommend to consult palliative care service for further discussion.  If she agrees to biopsy please consult IR.  She will need outpatient oncology and gynecology oncology evaluation.  I will arrange.  #IVC occlusion/thrombosis due to retroperitoneal mass, agree with heparin drip, recommend to transition to Eliquis at discharge. #Normocytic anemia, check iron TIBC ferritin, B12, folate.   Thank you for allowing me to participate in the care of this patient.    Earlie Server, MD, PhD Hematology Oncology  09/01/2021

## 2021-09-01 NOTE — Assessment & Plan Note (Addendum)
Management as outlined. Awaiting patient decision regarding biopsy.  --IR planning CT-guided biopsy today --Outpatient follow up with Oncology, Dr. Tasia Catchings

## 2021-09-02 ENCOUNTER — Inpatient Hospital Stay: Payer: Medicare Other

## 2021-09-02 ENCOUNTER — Other Ambulatory Visit (HOSPITAL_COMMUNITY): Payer: Self-pay

## 2021-09-02 ENCOUNTER — Encounter: Payer: Self-pay | Admitting: Family Medicine

## 2021-09-02 DIAGNOSIS — C541 Malignant neoplasm of endometrium: Secondary | ICD-10-CM | POA: Diagnosis not present

## 2021-09-02 LAB — GLUCOSE, CAPILLARY
Glucose-Capillary: 98 mg/dL (ref 70–99)
Glucose-Capillary: 87 mg/dL (ref 70–99)
Glucose-Capillary: 95 mg/dL (ref 70–99)
Glucose-Capillary: 118 mg/dL — ABNORMAL HIGH (ref 70–99)

## 2021-09-02 LAB — HEMOGLOBIN A1C
Hgb A1c MFr Bld: 5.9 % — ABNORMAL HIGH (ref 4.8–5.6)
Mean Plasma Glucose: 123 mg/dL

## 2021-09-02 LAB — BASIC METABOLIC PANEL
Anion gap: 9 (ref 5–15)
BUN: 7 mg/dL — ABNORMAL LOW (ref 8–23)
CO2: 26 mmol/L (ref 22–32)
Calcium: 8.9 mg/dL (ref 8.9–10.3)
Chloride: 101 mmol/L (ref 98–111)
Creatinine, Ser: 0.46 mg/dL (ref 0.44–1.00)
GFR, Estimated: >60 mL/min (ref >60)
Glucose, Bld: 99 mg/dL (ref 70–99)
Potassium: 3.6 mmol/L (ref 3.5–5.1)
Sodium: 136 mmol/L (ref 135–145)

## 2021-09-02 LAB — HEPARIN LEVEL (UNFRACTIONATED): Heparin Unfractionated: 0.57 IU/mL (ref 0.30–0.70)

## 2021-09-02 MED ORDER — MIDAZOLAM HCL 2 MG/2ML IJ SOLN
INTRAMUSCULAR | Status: AC
  Filled 2021-09-02: qty 2

## 2021-09-02 MED ORDER — FENTANYL CITRATE (PF) 100 MCG/2ML IJ SOLN
INTRAMUSCULAR | Status: AC
  Filled 2021-09-02: qty 2

## 2021-09-02 MED ORDER — FENTANYL CITRATE (PF) 100 MCG/2ML IJ SOLN
INTRAMUSCULAR | Status: AC | PRN
  Administered 2021-09-02 (×2): 25 ug via INTRAVENOUS

## 2021-09-02 MED ORDER — SODIUM CHLORIDE 0.9 % IV SOLN
INTRAVENOUS | Status: AC | PRN
  Administered 2021-09-02: 10 mL/h via INTRAVENOUS

## 2021-09-02 MED ORDER — MIDAZOLAM HCL 2 MG/2ML IJ SOLN
INTRAMUSCULAR | Status: AC | PRN
  Administered 2021-09-02 (×2): .5 mg via INTRAVENOUS

## 2021-09-02 MED ORDER — AMLODIPINE BESYLATE 5 MG PO TABS
5.0000 mg | ORAL_TABLET | Freq: Every day | ORAL | Status: DC
  Administered 2021-09-02 – 2021-09-03 (×2): 5 mg via ORAL
  Filled 2021-09-02 (×2): qty 1

## 2021-09-02 NOTE — Progress Notes (Signed)
       CROSS COVER NOTE  NAME: Brenda Bender MRN: [id number] DOB : Aug 06, 1940  Called to bedside by nursing staff who reported patient wanted to discuss CODE STATUS. Patient is alert and oriented and has full decision-making capacity. Patient articulated desire to be full code.  Patient's daughter is at bedside during this conversation. Reviewed with patient her desire to be shocked, intubated, and receive ACLS protocol in the event of a cardiac or respiratory arrest.  Patient chart updated to reflect patient's desire and request to be full code. Patient also requesting advanced directive paperwork. Consult placed to chaplains.  This document was prepared using Dragon voice recognition software and may include unintentional dictation errors.  Neomia Glass DNP, MHA, FNP-BC Nurse Practitioner [facility] Pager [phone]

## 2021-09-02 NOTE — Consult Note (Signed)
Chief Complaint: Patient was seen in consultation today for retroperitoneal mass at the request of Ezekiel Slocumb, DO  Referring Physician(s): Ezekiel Slocumb, DO  Supervising Physician: Juliet Rude  Patient Status: [place] - In-pt  History of Present Illness: Brenda Bender is a 81 y.o. female with PMHx significant for DM, HTN, hyperlipidemia, and per chart review in care everywhere endometroid adenocarcinoma s/p hysterectomy in 02/20/2020 at [facility]. The patient denies any knowledge of endometroid adenocarcinoma. Patient presented to ED 7/1 with complaints of dysuria, worsening lower abdominal pain, weight loss and poor appetite. CT imaging done revealed new retroperitoneal masses, lymphadenopathy, IVC occlusion/thrombosis secondary to tumor burden. Oncology has seen the patient and IR received request for biopsy.  The patient denies any current chest pain or shortness of breath. She denies any current blood thinner use outside of ASA '81mg'$  which was last taken on 7/1. She does complain of ongoing lower abdominal/pelvic burning pain and lower back sacral pain. She denies any current flank pain or abdominal pain. She denies any current nausea. The patient denies any history of sleep apnea or chronic oxygen use. She has no known complications to sedation.   Past Medical History:  Diagnosis Date   Arthritis    CHF (congestive heart failure) (HCC)    Diabetes mellitus without complication (HCC)    HLD (hyperlipidemia)    Hypertension     Past Surgical History:  Procedure Laterality Date   VENTRAL HERNIA REPAIR N/A 04/30/2019   Procedure: HERNIA REPAIR VENTRAL ADULT;  Surgeon: Jules Husbands, MD;  Location: [facility] ORS;  Service: General;  Laterality: N/A;   Allergies: Penicillins  Medications: Prior to Admission medications   Medication Sig Start Date End Date Taking? Authorizing Provider  aspirin EC 81 MG tablet Take 81 mg by mouth daily.   Yes [provider]   cetirizine (ZYRTEC) 10 MG tablet Take 10 mg by mouth daily. 07/24/21  Yes [provider]  JANUVIA 25 MG tablet Take 25 mg by mouth daily. 07/24/21  Yes [provider]  losartan (COZAAR) 100 MG tablet Take 100 mg by mouth daily. 07/24/21  Yes [provider]  metoprolol tartrate (LOPRESSOR) 50 MG tablet Take 50 mg by mouth 2 (two) times daily.   Yes [provider]  nitrofurantoin, macrocrystal-monohydrate, (MACROBID) 100 MG capsule Take 100 mg by mouth 2 (two) times daily. 08/26/21  Yes [provider]  vitamin B-12 (CYANOCOBALAMIN) 1000 MCG tablet Take 1,000 mcg by mouth once a week. 07/26/21  Yes [provider]  atorvastatin (LIPITOR) 40 MG tablet Take 40 mg by mouth daily.    [provider]  canagliflozin (INVOKANA) 100 MG TABS tablet Take 100 mg by mouth daily before breakfast. Patient not taking: Reported on 09/01/2021    [provider]  fluticasone (FLONASE) 50 MCG/ACT nasal spray Place 1 spray into both nostrils daily.    [provider]  irbesartan (AVAPRO) 300 MG tablet Take 300 mg by mouth daily. Patient not taking: Reported on 09/01/2021 07/03/21   [provider]     Family History  Problem Relation Age of Onset   Endometrial cancer Neg Hx     Social History   Socioeconomic History   Marital status: Widowed    Spouse name: Not on file   Number of children: Not on file   Years of education: Not on file   Highest education level: Not on file  Occupational History   Not on file  Tobacco  Use   Smoking status: Never   Smokeless tobacco: Never  Substance and Sexual Activity   Alcohol use: No   Drug use: No   Sexual activity: Not Currently  Other Topics Concern   Not on file  Social History Narrative   Not on file   Social Determinants of Health   Financial Resource Strain: Not on file  Food Insecurity: Not on file  Transportation Needs: Not on file  Physical Activity: Not on file   Stress: Not on file  Social Connections: Not on file   Review of Systems: A 12 point ROS discussed and pertinent positives are indicated in the HPI above.  All other systems are negative.  Review of Systems  Vital Signs: BP (!) 165/73   Pulse 87   Temp 98.4 F (36.9 C)   Resp 17   Ht '5\' 2"'$  (1.575 m)   Wt 139 lb 12.4 oz (63.4 kg)   SpO2 99%   BMI 25.56 kg/m   Physical Exam Constitutional:      General: She is not in acute distress.    Comments: Frail appearing  HENT:     Head: Normocephalic and atraumatic.  Cardiovascular:     Rate and Rhythm: Normal rate and regular rhythm.  Pulmonary:     Effort: Pulmonary effort is normal. No respiratory distress.  Abdominal:     Palpations: Abdomen is soft.     Tenderness: There is no abdominal tenderness.  Skin:    General: Skin is warm and dry.  Neurological:     Mental Status: She is alert and oriented to person, place, and time.    Imaging: CT ABDOMEN PELVIS W CONTRAST  Result Date: 08/31/2021 CLINICAL DATA:  Abdominal pain for several weeks.  Dysuria. EXAM: CT ABDOMEN AND PELVIS WITH CONTRAST TECHNIQUE: Multidetector CT imaging of the abdomen and pelvis was performed using the standard protocol following bolus administration of intravenous contrast. RADIATION DOSE REDUCTION: This exam was performed according to the departmental dose-optimization program which includes automated exposure control, adjustment of the mA and/or kV according to patient size and/or use of iterative reconstruction technique. CONTRAST:  51m OMNIPAQUE IOHEXOL 300 MG/ML  SOLN COMPARISON:  04/30/2019 FINDINGS: Lower Chest: No acute findings. Hepatobiliary: Sub-centimeter low-attenuation lesion in the central left hepatic lobe is too small to characterize, but stable consistent benign etiology. No new or enlarging liver lesions identified. Gallbladder is unremarkable. No evidence of biliary ductal dilatation. Pancreas:  No mass or inflammatory changes. Spleen:  Within normal limits in size and appearance. Adrenals/Urinary Tract: Normal adrenal glands few benign-appearing cysts stable. Renal masses identified bilateral hydronephrosis shows significant change. Unremarkable unopacified urinary bladder. Stomach/Bowel: No evidence of obstruction, inflammatory process or abnormal fluid collections. Vascular/Lymphatic: Abdominal retroperitoneal lymphadenopathy is seen is increased since previous study. Adenopathy in the aortocaval space measures 3.8 x 3.9 cm and is new since previous study. This encases and obstructs the IVC, and causes acute thrombosis of the right gonadal vein. Largest area of lymphadenopathy in the left paraaortic region measures 2.8 x 2.2 cm slightly increased since previous study. A new heterogeneously enhancing mass is seen involving left iliopsoas muscle encasing left external iliac vessels. This measures 7.5 x 7.3 cm on image 50/2. A new retroperitoneal mass is seen in the right presacral space which encases the right internal iliac vessels and causes osteolysis of right anterior upper sacrum. This measures 6.1 x 3.6 cm on image 55/2. Reproductive: Prior hysterectomy noted. Adnexal regions are unremarkable in appearance. Other:  None.  Musculoskeletal: Osteolysis is seen involving the right anterior upper sacrum of the presacral mass described above. No other suspicious bone lesions identified. IMPRESSION: New retroperitoneal masses involving the left iliopsoas muscle and right presacral space, causing osteolysis of the right anterior upper sacrum. This is likely due to metastatic disease. Metastatic disease. New abdominal retroperitoneal lymphadenopathy, also likely to metastatic disease. This occludes the IVC, causing acute thrombosis of the right gonadal vein. Stable bilateral hydronephrosis. Electronically Signed   By: Marlaine Hind M.D.   On: 08/31/2021 17:32    Labs:  CBC: Recent Labs    08/31/21 1223 09/01/21 0529  WBC 11.8* 10.4  HGB 10.1*  8.8*  HCT 32.5* 27.9*  PLT 432* 353    COAGS: Recent Labs    08/31/21 1932  INR 1.1  APTT 32    BMP: Recent Labs    08/31/21 1223 09/01/21 0529 09/02/21 0427  NA 130* 135 136  K 3.4* 3.7 3.6  CL 97* 102 101  CO2 '23 25 26  '$ GLUCOSE 150* 96 99  BUN 12 8 7*  CALCIUM 8.9 8.7* 8.9  CREATININE 0.63 0.46 0.46  GFRNONAA >60 >60 >60    LIVER FUNCTION TESTS: Recent Labs    08/31/21 1223 09/01/21 0529  BILITOT 0.9 0.7  AST 20 14*  ALT 8 7  ALKPHOS 62 53  PROT 7.8 6.6  ALBUMIN 3.3* 2.5*    Assessment and Plan: This is a 81 year old female with PMHx significant for DM, HTN, hyperlipidemia, endometrial adenocarcinoma s/p hysterectomy in 02/2020 at [facility]. Patient presented to ED 7/1 with complaints of dysuria, worsening lower abdominal pain, weight loss and poor appetite. CT imaging done revealed new retroperitoneal masses, lymphadenopathy, IVC occlusion/thrombosis secondary to tumor burden. Findings are concerning for metastatic disease, Oncology has seen the patient and IR received request for biopsy. Patient currently on heparin infusion for IVC occlusion- will turn heparin off 2 hours prior to procedure- discussed with primary team.   The patient has been NPO, imaging, labs and vitals have been reviewed.  Risks and benefits of CT guided retroperitoneal mass biopsy with moderate sedation was discussed with the patient and the patient's family including, but not limited to bleeding, infection, damage to adjacent structures or low yield requiring additional tests.  All of the questions were answered and there is agreement to proceed.  Consent signed and in chart.  Thank you for this interesting consult.  I greatly enjoyed meeting Brenda Bender and look forward to participating in their care.  A copy of this report was sent to the requesting provider on this date.  Electronically Signed: Hedy Jacob, PA-C 09/02/2021, 11:34 AM   I spent a total of 20 Minutes in face to  face in clinical consultation, greater than 50% of which was counseling/coordinating care for retroperitoneal mass.

## 2021-09-02 NOTE — Procedures (Signed)
Interventional Radiology Procedure Note  Date of Procedure: 09/02/2021  Procedure: CT biopsy of abdominopelvic mass   Findings:  1. CT biopsy of abdominopelvic mass, 18ga x4 passes    Complications: No immediate complications noted.   Estimated Blood Loss: minimal  Follow-up and Recommendations: 1. Bedrest 1hour    Albin Felling, MD  Vascular & Interventional Radiology  09/02/2021 3:40 PM

## 2021-09-02 NOTE — Progress Notes (Addendum)
Mobility Specialist - Progress Note   09/02/21 1627  Mobility  Activity Ambulated with assistance in room;Transferred to/from [facility]  Level of Assistance Minimal assist, patient does 75% or more  Assistive Device None;BSC  Distance Ambulated (ft) 3 ft  Activity Response Tolerated well  $Mobility charge 1 Mobility     Pt lying in bed upon arrival, utilizing RA. Daughter in room able to provide assistance with transfer. Pain in toe. Pt able to come into sitting with modA---assist onto LE provided as well. Pt stood with minA and SPT to [facility] with author and daughter providing BUE support. +2 for repositioning to [facility]. Alarm set, needs in reach, family at bedside.    Kathee Delton Mobility Specialist 09/02/21, 4:37 PM

## 2021-09-02 NOTE — Progress Notes (Signed)
ANTICOAGULATION CONSULT NOTE  Pharmacy Consult for Heparin Drip Indication: DVT  Allergies  Allergen Reactions   Penicillins Nausea And Vomiting    Patient Measurements: Height: '5\' 2"'$  (157.5 cm) Weight: 63.4 kg (139 lb 12.4 oz) IBW/kg (Calculated) : 50.1 Heparin Dosing Weight:    Vital Signs: Temp: 98.2 F (36.8 C) (07/03 0522) Temp Source: Oral (07/02 2005) BP: 143/65 (07/03 0522) Pulse Rate: 81 (07/03 0522)  Labs: Recent Labs    08/31/21 1223 08/31/21 1932 09/01/21 0529 09/01/21 0529 09/01/21 1446 09/01/21 2303 09/02/21 0427  HGB 10.1*  --  8.8*  --   --   --   --   HCT 32.5*  --  27.9*  --   --   --   --   PLT 432*  --  353  --   --   --   --   APTT  --  32  --   --   --   --   --   LABPROT  --  13.9  --   --   --   --   --   INR  --  1.1  --   --   --   --   --   HEPARINUNFRC  --   --  0.78*   < > 0.64 0.59 0.57  CREATININE 0.63  --  0.46  --   --   --  0.46   < > = values in this interval not displayed.     Estimated Creatinine Clearance: 49.1 mL/min (by C-G formula based on SCr of 0.46 mg/dL).   Medical History: Past Medical History:  Diagnosis Date   Arthritis    CHF (congestive heart failure) ([place])    Diabetes mellitus without complication (HCC)    HLD (hyperlipidemia)    Hypertension    Assessment: Patient is an 81yo female admitted for abdominal pain. 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 Pharmacy consulted for Heparin dosing.  Reviewed prior to admission medications and no anticoagulants noted.  Goal of Therapy:  Heparin level 0.3-0.7 units/ml Monitor platelets by anticoagulation protocol: Yes  7/02 0529 HL 0.78, supratherapeutic 7/02 1446 HL 0.64  therapeutic x1 7/02 2303 HL 0.59 therapeutic x 2 7/03 0427 HL 0.57 therapeutic x 3   Plan:  Continue heparin infusion at 900 units/hr Recheck HL daily w/ AM labs while therapeutic CBC daily while on heparin  Renda Rolls, PharmD, [facility] 09/02/2021 6:31  AM

## 2021-09-02 NOTE — Progress Notes (Signed)
   09/02/21 1421  Clinical Encounter Type  Visited With Patient not available   Chaplain Janiyla Long attempted to visit with pt to discuss her over all well-being considering current health status and also to discuss HCPOA planning. At this time pt is receiving several visitors. Will return for f/u or request on-call chaplain to f/u this evening or 7/4.

## 2021-09-02 NOTE — Progress Notes (Signed)
Progress Note   Patient: Brenda Bender DZH:[id number] DOB: [birth year] DOA: 08/31/2021     2 DOS: the patient was seen and examined on 09/02/2021   Brief hospital course: 81 year old female with past medical history of non-insulin-dependent type 2 diabetes, hypertension, hyperlipidemia, endometrial adenocarcinoma status post hysterectomy in December 2021 at [facility] who presented to the ED on 08/31/2021 for evaluation of worsening lower abdominal pain and dysuria.  She also reported poor appetite with reduced oral intake, gradual unintended weight loss.  She saw PCP who prescribed a course of Macrobid, patient noted no improvement in her symptoms, they progressively worsen despite this.  In the ED, CT of the abdomen and pelvis revealed new retroperitoneal masses involving the left iliopsoas muscle, right presacral space with evidence of osteolysis in the right anterior upper sacrum.  Findings are concerning for recurrence of endometrial adenocarcinoma with metastatic spread.  Scan also showed occlusion of the IVC causing acute thrombosis of the right gonadal vein.  She was started on IV heparin and vascular surgery consulted.  Oncology consulted for recommendations regarding metastatic malignancy.  Assessment and Plan: * Adenocarcinoma of endometrium [facility]) Presented with several week history of progressively worsening constipation and lower abdominal discomfort in the setting of known endometrial adenocarcinoma diagnosed in December 2021.  Patient had hysterectomy and has since not followed up with oncology.  CT findings with new retroperitoneal masses and gonadal vein thrombosis are concerning for metastatic disease. -- Oncology consulted appreciate recommendations -- Palliative care consulted -- Supportive care with pain control, antiemetics, laxatives for constipation -- Oncology recommends biopsy, patient and family deciding whether to pursue this   Deep venous thrombosis ([place]) Patient has thrombus of  the right gonadal vein likely secondary to malignancy. -- Continue IV heparin -- DOAC versus Lovenox at discharge, defer to oncology -- Vascular surgery was consulted on admission and patient felt to be poor candidate for procedural intervention given current tumor burden  Hyponatremia Sodium 130 on admission, likely due to poor p.o. intake and hypovolemia. Resolved with IV hydration. --Follow BMP -- Stop IV fluids  Hypokalemia K3.4 on admission was replaced. Resolved, K3.7 Monitor BMP replace K as needed   Type 2 diabetes mellitus with hyperglycemia, without long-term current use of insulin (HCC) Sliding scale NovoLog.  Home regimen is held.  Follow-up pending hemoglobin A1c.  Adjust insulin as needed for goal of 140-180  Normocytic anemia Hemoglobin 10.1 on admission Anemia panel reflects iron deficiency, normal folic acid, M19 level pending. -- Follow-up pending B12 level -- Follow CBC -- Given recent issues with constipation we will hold off oral iron for now  Hemoglobin today 8.8, likely dilutional, all cell lines are down since previous.   Essential hypertension Resumed on home antihypertensives --metoprolol and losartan. 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 Awaiting patient decision regarding biopsy.  --IR planning CT-guided biopsy today --Outpatient follow up with Oncology, Dr. Tasia Catchings        Subjective: Patient awake resting in bed, several of her adult children visiting.  Radiology PA in room obtaining  consent for biopsy procedure, explained to  pt and family and addressed questions.  Pt feels better with Foley removed.  Overall feels better today.  Had a couple of loose BM's.  No significant pain right now.    Physical Exam: Vitals:   09/02/21 1530 09/02/21 1548 09/02/21 1600 09/02/21 1637  BP: 138/74 (!) 114/57 (!) 124/57 (!) 124/103  Pulse: 79 70 73 80  Resp: '18 14 15 18  '$ Temp:    98.3 F (36.8 C)  TempSrc:      SpO2: 100% 97% 97% 98%  Weight:      Height:       General exam: awake, alert, no acute distress HEENT: atraumatic, clear conjunctiva, anicteric sclera, moist mucus membranes, hearing grossly normal  Respiratory system: CTAB, no wheezes, rales or rhonchi, normal respiratory effort. Cardiovascular system: normal S1/S2,  RRR, no pedal edema.   Gastrointestinal system: soft, nontender nondistended abdomen hypoactive bowel sounds Central nervous system: A&O x3. no gross focal neurologic deficits, normal speech Extremities: moves all, no edema, normal tone Skin: dry, intact, normal temperature Psychiatry: Anxious mood, congruent affect, judgement and insight appear normal   Data Reviewed:  Notable labs: BUN 7 otherwise normal BMP  Family Communication: Daughter at bedside on rounds  Disposition: Status is: Inpatient Remains inpatient appropriate because: Ongoing evaluation not appropriate for the outpatient setting.  Remains on IV heparin for VTE treatment.    Planned Discharge Destination: Home    Time spent: 40 minutes  Author: Ezekiel Slocumb, DO 09/02/2021 5:26 PM  For on call review [email or web address].

## 2021-09-02 NOTE — Progress Notes (Signed)
Telemetry discontinued per patient request. Pt educated, but still refused telemetry.

## 2021-09-02 NOTE — Progress Notes (Signed)
ANTICOAGULATION CONSULT NOTE  Pharmacy Consult for Heparin Drip Indication: DVT  Allergies  Allergen Reactions   Penicillins Nausea And Vomiting    Patient Measurements: Height: '5\' 2"'$  (157.5 cm) Weight: 63.4 kg (139 lb 12.4 oz) IBW/kg (Calculated) : 50.1 Heparin Dosing Weight:    Vital Signs: Temp: 98.3 F (36.8 C) (07/02 2005) Temp Source: Oral (07/02 2005) BP: 181/74 (07/02 2005) Pulse Rate: 80 (07/02 2005)  Labs: Recent Labs    08/31/21 1223 08/31/21 1932 09/01/21 0529 09/01/21 1446 09/01/21 2303  HGB 10.1*  --  8.8*  --   --   HCT 32.5*  --  27.9*  --   --   PLT 432*  --  353  --   --   APTT  --  32  --   --   --   LABPROT  --  13.9  --   --   --   INR  --  1.1  --   --   --   HEPARINUNFRC  --   --  0.78* 0.64 0.59  CREATININE 0.63  --  0.46  --   --      Estimated Creatinine Clearance: 49.1 mL/min (by C-G formula based on SCr of 0.46 mg/dL).   Medical History: Past Medical History:  Diagnosis Date   Arthritis    CHF (congestive heart failure) ([place])    Diabetes mellitus without complication (HCC)    HLD (hyperlipidemia)    Hypertension    Assessment: Patient is an 81yo female admitted for abdominal pain. 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 Pharmacy consulted for Heparin dosing.  Reviewed prior to admission medications and no anticoagulants noted.  Goal of Therapy:  Heparin level 0.3-0.7 units/ml Monitor platelets by anticoagulation protocol: Yes  7/02 0529 HL 0.78, supratherapeutic 7/02 1446 HL 0.64  therapeutic x1 7/02 2303 HL 0.59 therapeutic x 2   Plan:  continue heparin infusion at 900 units/hr Recheck HL daily w/ AM labs while therapeutic CBC daily while on heparin  Renda Rolls, PharmD, [facility] 09/02/2021 12:09 AM

## 2021-09-02 NOTE — TOC Benefit Eligibility Note (Signed)
Patient Research scientist (life sciences) completed.     The patient is currently admitted and upon discharge could be taking Eliquis 5 mg.   The current 30 day co-pay is, $0.   The patient is insured through [employer].

## 2021-09-03 DIAGNOSIS — C541 Malignant neoplasm of endometrium: Secondary | ICD-10-CM | POA: Diagnosis not present

## 2021-09-03 LAB — CBC
HCT: 28.8 % — ABNORMAL LOW (ref 36.0–46.0)
Hemoglobin: 9.2 g/dL — ABNORMAL LOW (ref 12.0–15.0)
MCH: 28.6 pg (ref 26.0–34.0)
MCHC: 31.9 g/dL (ref 30.0–36.0)
MCV: 89.4 fL (ref 80.0–100.0)
Platelets: 386 10*3/uL (ref 150–400)
RBC: 3.22 MIL/uL — ABNORMAL LOW (ref 3.87–5.11)
RDW: 13.9 % (ref 11.5–15.5)
WBC: 11.4 10*3/uL — ABNORMAL HIGH (ref 4.0–10.5)
nRBC: 0 % (ref 0.0–0.2)

## 2021-09-03 LAB — GLUCOSE, CAPILLARY
Glucose-Capillary: 98 mg/dL (ref 70–99)
Glucose-Capillary: 102 mg/dL — ABNORMAL HIGH (ref 70–99)

## 2021-09-03 LAB — HEPARIN LEVEL (UNFRACTIONATED)
Heparin Unfractionated: 0.48 IU/mL (ref 0.30–0.70)
Heparin Unfractionated: 0.55 IU/mL (ref 0.30–0.70)

## 2021-09-03 MED ORDER — AMLODIPINE BESYLATE 5 MG PO TABS: 5.0000 mg | ORAL_TABLET | Freq: Every day | ORAL | 1 refills | Status: DC

## 2021-09-03 MED ORDER — ACETAMINOPHEN 325 MG PO TABS: 650.0000 mg | ORAL_TABLET | Freq: Four times a day (QID) | ORAL | Status: DC | PRN

## 2021-09-03 MED ORDER — ONDANSETRON HCL 4 MG PO TABS: 4.0000 mg | ORAL_TABLET | Freq: Four times a day (QID) | ORAL | 0 refills | Status: AC | PRN

## 2021-09-03 MED ORDER — POLYETHYLENE GLYCOL 3350 17 G PO PACK: 17.0000 g | PACK | Freq: Every day | ORAL | 0 refills | Status: AC | PRN

## 2021-09-03 MED ORDER — APIXABAN 5 MG PO TABS: 5.0000 mg | ORAL_TABLET | Freq: Two times a day (BID) | ORAL | Status: DC

## 2021-09-03 MED ORDER — APIXABAN 5 MG PO TABS: ORAL_TABLET | ORAL | 0 refills | Status: DC

## 2021-09-03 MED ORDER — APIXABAN 5 MG PO TABS
10.0000 mg | ORAL_TABLET | Freq: Two times a day (BID) | ORAL | Status: DC
  Administered 2021-09-03: 10 mg via ORAL
  Filled 2021-09-03: qty 2

## 2021-09-03 MED ORDER — HYDROCODONE-ACETAMINOPHEN 5-325 MG PO TABS: 1.0000 | ORAL_TABLET | Freq: Four times a day (QID) | ORAL | 0 refills | Status: DC | PRN

## 2021-09-03 NOTE — Evaluation (Signed)
Physical Therapy Evaluation Patient Details Name: Brenda Bender MRN: [id number] DOB: September 29, 1940 Today's Date: 09/03/2021  History of Present Illness  Patient is a 81 yo female with pmh of non-insulin-dependent type 2 diabetes, HTN, HLD, endometrial adenocarcinoma s/p hysterectomy presenting to ED on 08/31/21 for eval of worsening lower abdominal pain and dysruria.  Clinical Impression  Pt presents A & O x 4 laying in bed with daughter present and on room air. She is close to functional baseline, but now requires use of UE support while ambulating to remain steady. First bout of ambulation, pt attempted without AD but needed intermittent UE support to remain steady. Daughter and family present to provide ongoing additional support while patient remains home and they can provide 24/7 assistance. Pt and daughter requesting 2 wheeled walker as AD as patient felt improved confidence and steadiness while ambulating. PT recommending outpatient PT so that patient can improve ambulation to baseline where she no longer needs AD and to improve dynamic balance to return to PLOF and to decrease caregiver burden.        Recommendations for follow up therapy are one component of a multi-disciplinary discharge planning process, led by the attending physician.  Recommendations may be updated based on patient status, additional functional criteria and insurance authorization.  Follow Up Recommendations Outpatient PT      Assistance Recommended at Discharge PRN  Patient can return home with the following  Assist for transportation;Assistance with cooking/housework    Equipment Recommendations Rolling walker (2 wheels)  Recommendations for Other Services       Functional Status Assessment Patient has had a recent decline in their functional status and demonstrates the ability to make significant improvements in function in a reasonable and predictable amount of time.     Precautions / Restrictions  Precautions Precautions: None Restrictions Weight Bearing Restrictions: No      Mobility  Bed Mobility Overal bed mobility: Modified Independent             General bed mobility comments: Increased time for setup and execution of task    Transfers Overall transfer level: Needs assistance   Transfers: Sit to/from Stand Sit to Stand: Supervision           General transfer comment: Increasd time to stand up and execute task    Ambulation/Gait Ambulation/Gait assistance: Modified independent (Device/Increase time) Gait Distance (Feet): 300 Feet (10' for first bout without AD) Assistive device: Standard walker Gait Pattern/deviations: Step-through pattern       General Gait Details: SBA throughout when use FWW, unable to ambulate with steadiness without use of AD needed furniture for intermittent UE support to remain steady  Stairs            Wheelchair Mobility    Modified Rankin (Stroke Patients Only)       Balance Overall balance assessment: Modified Independent (Dynamic balance requiring use of UE support for steadiness)                                           Pertinent Vitals/Pain Pain Assessment Pain Assessment: No/denies pain    Home Living Family/patient expects to be discharged to:: Private residence Living Arrangements: Alone Available Help at Discharge: Other (Comment) (Family will be present to monitor and care for mother) Type of Home: House Home Access: Ramped entrance       Home Layout: One level  Home Equipment: Rollator (4 wheels);Cane - single point      Prior Function Prior Level of Function : Independent/Modified Independent               ADLs Comments: Daughters assisted in cleaning home and getting groceries     Hand Dominance        Extremity/Trunk Assessment   Upper Extremity Assessment Upper Extremity Assessment: Defer to OT evaluation    Lower Extremity Assessment Lower  Extremity Assessment: Overall WFL for tasks assessed    Cervical / Trunk Assessment Cervical / Trunk Assessment: Normal  Communication   Communication: No difficulties  Cognition Arousal/Alertness: Awake/alert Behavior During Therapy: WFL for tasks assessed/performed Overall Cognitive Status: Within Functional Limits for tasks assessed                                          General Comments      Exercises     Assessment/Plan    PT Assessment Patient needs continued PT services  PT Problem List Decreased balance       PT Treatment Interventions DME instruction;Therapeutic activities;Therapeutic exercise;Balance training;Modalities;Gait training;Stair training;Patient/family education    PT Goals (Current goals can be found in the Care Plan section)  Acute Rehab PT Goals Patient Stated Goal: To return home and be able to do things like I did before PT Goal Formulation: With patient/family Time For Goal Achievement: 09/17/21 Potential to Achieve Goals: Good    Frequency Min 1X/week     Co-evaluation               AM-PAC PT "6 Clicks" Mobility  Outcome Measure Help needed turning from your back to your side while in a flat bed without using bedrails?: None Help needed moving from lying on your back to sitting on the side of a flat bed without using bedrails?: None Help needed moving to and from a bed to a chair (including a wheelchair)?: A Little Help needed standing up from a chair using your arms (e.g., wheelchair or bedside chair)?: None Help needed to walk in hospital room?: A Little Help needed climbing 3-5 steps with a railing? : A Lot 6 Click Score: 20    End of Session Equipment Utilized During Treatment: Gait belt Activity Tolerance: Patient tolerated treatment well Patient left: in bed;with call bell/phone within reach;with bed alarm set;with family/visitor present Nurse Communication: Mobility status PT Visit Diagnosis:  Unsteadiness on feet (R26.81);Other abnormalities of gait and mobility (R26.89)    Time: 1140-1150 PT Time Calculation (min) (ACUTE ONLY): 10 min   Charges:   PT Evaluation $PT Eval Low Complexity: 1 Low        Bradly Chris PT, DPT  09/03/2021, 1:50 PM

## 2021-09-03 NOTE — Progress Notes (Signed)
     [place] REFERRAL        Occupational Therapy * Physical Therapy * Speech Therapy                           DATE : 08-26-2040  PATIENT NAME : Brenda Bender  PATIENT MRN  [id number]       DIAGNOSIS/DIAGNOSIS CODE C54.1  DATE OF DISCHARGE: 09/03/21       PRIMARY CARE PHYSICIAN    Charlean Merl, NP  PCP PHONE/FAX [phone]     Dear Provider (Name: [facility] Fax: [phone])   I certify that I have examined this patient and that occupational/physical/speech therapy is necessary on an outpatient basis.    The patient has expressed interest in completing their recommended course of therapy at your  location.  Once a formal order from the patient's primary care physician has been obtained, please contact him/her to schedule an appointment for evaluation at your earliest convenience.   '[X]'$   Physical Therapy Evaluate and Treat  [  ]  Occupational Therapy Evaluate and Treat  [  ]  Speech Therapy Evaluate and Treat         The patient's primary care physician (listed above) must furnish and be responsible for a formal order such that the recommended services may be furnished while under the primary physician's care, and that the plan of care will be established and reviewed every 30 days (or more often if condition necessitates).  [place], [place]

## 2021-09-03 NOTE — Care Management Important Message (Signed)
Important Message  Patient Details  Name: Brenda Bender MRN: [id number] Date of Birth: 1940/12/21   Medicare Important Message Given:  Yes     Dannette Barbara 09/03/2021, 12:14 PM

## 2021-09-03 NOTE — Plan of Care (Signed)

## 2021-09-03 NOTE — Progress Notes (Signed)
ANTICOAGULATION CONSULT NOTE  Pharmacy Consult for Heparin Drip Indication: DVT  Allergies  Allergen Reactions   Penicillins Nausea And Vomiting    Patient Measurements: Height: '5\' 2"'$  (157.5 cm) Weight: 63.4 kg (139 lb 12.4 oz) IBW/kg (Calculated) : 50.1 Heparin Dosing Weight:    Vital Signs: Temp: 97.9 F (36.6 C) (07/03 1954) Temp Source: Oral (07/03 1954) BP: 104/60 (07/03 1954) Pulse Rate: 88 (07/03 1954)  Labs: Recent Labs    08/31/21 1223 08/31/21 1932 09/01/21 0529 09/01/21 1446 09/01/21 2303 09/02/21 0427 09/03/21 0057  HGB 10.1*  --  8.8*  --   --   --   --   HCT 32.5*  --  27.9*  --   --   --   --   PLT 432*  --  353  --   --   --   --   APTT  --  32  --   --   --   --   --   LABPROT  --  13.9  --   --   --   --   --   INR  --  1.1  --   --   --   --   --   HEPARINUNFRC  --   --  0.78*   < > 0.59 0.57 0.48  CREATININE 0.63  --  0.46  --   --  0.46  --    < > = values in this interval not displayed.     Estimated Creatinine Clearance: 49.1 mL/min (by C-G formula based on SCr of 0.46 mg/dL).   Medical History: Past Medical History:  Diagnosis Date   Arthritis    CHF (congestive heart failure) ([place])    Diabetes mellitus without complication (HCC)    HLD (hyperlipidemia)    Hypertension    Assessment: Patient is an 81yo female admitted for abdominal pain. 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 Pharmacy consulted for Heparin dosing.  Reviewed prior to admission medications and no anticoagulants noted.  Goal of Therapy:  Heparin level 0.3-0.7 units/ml Monitor platelets by anticoagulation protocol: Yes  7/02 0529 HL 0.78, supratherapeutic 7/02 1446 HL 0.64  therapeutic x1 7/02 2303 HL 0.59 therapeutic x 2 7/03 0427 HL 0.57 therapeutic x 3 7/04 0057 HL 0.48 therapeutic X 1 following restart on 7/3 @ 1700   Plan:  7/4:  HL @ 0057 = 0.48, therapeutic X 1 following restart. Will continue pt on current rate and recheck HL on  7/4 @ 0900.   Joah Patlan D 09/03/2021 3:06 AM

## 2021-09-03 NOTE — Discharge Summary (Addendum)
Physician Discharge Summary   Patient: Brenda Bender MRN: [id number] DOB: 1940-12-06  Admit date:     08/31/2021  Discharge date: 09/03/21  Discharge Physician: Ezekiel Slocumb   PCP: Wynonia Sours, PA   Recommendations at discharge:    Follow up with Dr. 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 *  Hospital Course: 81 year old female with past medical history of non-insulin-dependent type 2 diabetes, hypertension, hyperlipidemia, endometrial adenocarcinoma status post hysterectomy in December 2021 at [facility] who presented to the ED on 08/31/2021 for evaluation of worsening lower abdominal pain and dysuria.  She also reported poor appetite with reduced oral intake, gradual unintended weight loss.  She saw PCP who prescribed a course of Macrobid, patient noted no improvement in her symptoms, they progressively worsen despite this.  In the ED, CT of the abdomen and pelvis revealed new retroperitoneal masses involving the left iliopsoas muscle, right presacral space with evidence of osteolysis in the right anterior upper sacrum.  Findings are concerning for recurrence of endometrial adenocarcinoma with metastatic spread.  Scan also showed occlusion of the IVC causing acute thrombosis of the right gonadal vein.  She was started on IV heparin and vascular surgery consulted.  Oncology consulted  for recommendations regarding metastatic malignancy.  Assessment and Plan: * Adenocarcinoma of endometrium [facility]) Presented with several week history of progressively worsening constipation and lower abdominal discomfort in the setting of known endometrial adenocarcinoma diagnosed in December 2021.  Patient had hysterectomy and has since not followed up with oncology.  CT findings with new retroperitoneal masses and gonadal vein thrombosis are concerning for metastatic disease. -- Oncology consulted appreciate recommendations -- Palliative care consulted -- Supportive care with pain control, antiemetics, laxatives for constipation -- Oncology recommends biopsy, patient and family deciding whether to pursue this   Deep venous thrombosis ([place]) Patient has thrombus of the right gonadal vein likely secondary to malignancy. -- Treated with IV heparin -- Transitioned to Eliquis per oncology --Oncology follow-up as outpatient -- Vascular surgery was consulted on admission and patient felt to be poor candidate for procedural intervention given current tumor burden  Hyponatremia Sodium 130 on admission, likely due to poor p.o. intake and hypovolemia.  Resolved with IV hydration. -- Repeat BMP in 1 to 2 weeks at follow-up -- Stop IV fluids  Hypokalemia K3.4 on admission was replaced. Resolved, K3.7 -- Repeat BMP in 1 to 2 weeks at follow-up   Type 2 diabetes mellitus with hyperglycemia, without long-term current use of insulin (HCC) Treated with sliding scale NovoLog.   Home regimen was held, resumed at discharge.  Primary care follow-up  Normocytic anemia Hemoglobin 10.1 on admission Anemia panel reflects iron deficiency, normal folic acid, Q11 level pending. -- Follow-up pending B12 level -- Repeat CBC at follow-up -- Given recent issues with constipation we will hold off oral iron for now  Hemoglobin declined slightly to 8.8, likely dilutional,  improved today 9.2.   Essential  hypertension Resumed on home antihypertensives --metoprolol and losartan. --added amlodipine 5 mg as BP remains elevated -- Close PCP follow-up --recommended home BP monitoring between discharge and PCP visit    Mixed diabetic hyperlipidemia associated with type 2 diabetes mellitus ([place]) Continue home statin   Acute deep vein thrombosis (DVT) of pelvic vein Thrombosis involving right gonadal vein in the setting of malignancy. 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 Awaiting patient decision regarding biopsy.  --IR planning CT-guided biopsy today --Outpatient follow up with Oncology, Dr. Tasia Catchings  Retroperitoneal soft tissue metastatic carcinoma is a clinically significant diagnosis        Consultants: Oncology, interventional radiology, palliative care Procedures performed: Biopsy of retroperitoneal mass under CT guidance Disposition: Home health Diet recommendation:  Discharge Diet Orders (From admission, onward)     Start     Ordered   09/03/21 0000  Diet - low sodium heart healthy        09/03/21 1217           Cardiac and Carb modified diet DISCHARGE MEDICATION: Allergies as of 09/03/2021       Reactions   Penicillins Nausea And Vomiting        Medication List     STOP taking these medications    Invokana 100 MG Tabs tablet Generic drug: canagliflozin   irbesartan 300 MG tablet Commonly known as: AVAPRO   nitrofurantoin (macrocrystal-monohydrate) 100 MG capsule Commonly known as: MACROBID       TAKE these medications    acetaminophen 325 MG tablet Commonly known as: TYLENOL Take 2 tablets (650 mg total) by mouth every 6 (six) hours as needed for mild pain, headache or fever (or Fever >/= 101).   amLODipine 5 MG tablet Commonly known as: NORVASC Take 1 tablet (5 mg total) by mouth daily. Start taking on: September 04, 2021   apixaban 5 MG Tabs  tablet Commonly known as: ELIQUIS Take 2 tablets (10 mg total) by mouth 2 (two) times daily for 7 days, THEN 1 tablet (5 mg total) 2 (two) times daily. Start taking on: September 03, 2021   aspirin EC 81 MG tablet Take 81 mg by mouth daily.   atorvastatin 40 MG tablet Commonly known as: LIPITOR Take 40 mg by mouth daily.   cetirizine 10 MG tablet Commonly known as: ZYRTEC Take 10 mg by mouth daily.   fluticasone 50 MCG/ACT nasal spray Commonly known as: FLONASE Place 1 spray into both nostrils daily.   HYDROcodone-acetaminophen 5-325 MG tablet Commonly known as: NORCO/VICODIN Take 1 tablet by mouth every 6 (six) hours as needed (for pain not controlled by Tylenol).   Januvia 25 MG tablet Generic drug: sitaGLIPtin Take 25 mg by mouth daily.   losartan 100 MG tablet Commonly known as: COZAAR Take 100 mg by mouth daily.   metoprolol tartrate 50 MG tablet Commonly known as: LOPRESSOR Take 50 mg by mouth 2 (two) times daily.   ondansetron 4 MG tablet Commonly known as: ZOFRAN Take 1 tablet (4 mg total) by mouth every 6 (six) hours as needed for nausea.   polyethylene glycol 17 g packet Commonly known as: MIRALAX / GLYCOLAX Take 17 g by mouth daily as needed for mild constipation.   vitamin B-12 1000 MCG tablet Commonly known as: CYANOCOBALAMIN Take 1,000 mcg by mouth once a week.  Follow-up Information     Earlie Server, MD. Schedule an appointment as soon as possible for a visit.   Specialty: Oncology Contact information: [place] [address] [phone]                Discharge Exam: Danley Danker Weights   08/31/21 1220 08/31/21 2000 09/03/21 0500  Weight: 60.8 kg 63.4 kg 63.4 kg   General exam: awake, alert, no acute distress HEENT: atraumatic, clear conjunctiva, anicteric sclera, moist mucus membranes, hearing grossly normal  Respiratory system: CTAB, no wheezes, rales or rhonchi, normal respiratory effort. Cardiovascular system:  normal S1/S2,  RRR, no JVD, murmurs, rubs, gallops,  no pedal edema.   Gastrointestinal system: soft, NT, ND, no HSM felt, +bowel sounds. Central nervous system: A&O x4. no gross focal neurologic deficits, normal speech Extremities: moves all , no edema, normal tone Skin: dry, intact, normal temperature, normal color, No rashes, lesions or ulcers Psychiatry: normal mood, congruent affect, judgement and insight appear normal   Condition at discharge: stable  The results of significant diagnostics from this hospitalization (including imaging, microbiology, ancillary and laboratory) are listed below for reference.   Imaging Studies: CT ABDOMINAL MASS BIOPSY  Result Date: 09/02/2021 INDICATION: Mass EXAM: CT-guided core needle biopsy of abdominopelvic mass MEDICATIONS: None. ANESTHESIA/SEDATION: Moderate (conscious) sedation was employed during this procedure. A total of Versed 1 mg and Fentanyl 50 mcg was administered intravenously. Moderate Sedation Time: 12 minutes. The patient's level of consciousness and vital signs were monitored continuously by radiology nursing throughout the procedure under my direct supervision. FLUOROSCOPY TIME:  Fluoroscopy Time: N/a COMPLICATIONS: None immediate. PROCEDURE: Informed written consent was obtained from the patient after a thorough discussion of the procedural risks, benefits and alternatives. All questions were addressed. Maximal Sterile Barrier Technique was utilized including caps, mask, sterile gowns, sterile gloves, sterile drape, hand hygiene and skin antiseptic. A timeout was performed prior to the initiation of the procedure. The patient was placed supine on the exam table. Limited CT of the abdomen and pelvis was performed for planning purposes. This demonstrated multiple masses consistent with recent CT. A left lower abdominopelvic mass was selected as appropriate for biopsy. Skin entry site was marked, and the overlying skin was prepped and draped in the  standard sterile fashion. Local analgesia was obtained with 1% lidocaine. Using intermittent CT fluoroscopy, a 17 gauge introducer needle was advanced towards the identified lesion. Subsequently, core needle biopsy was performed using an 18 gauge core biopsy device x4 total passes. Specimens were submitted in formalin to pathology for further handling. Limited postprocedure imaging demonstrated no complicating feature. The patient tolerated the procedure well, and was transferred to recovery in stable condition. IMPRESSION: Successful CT-guided core needle biopsy of abdominopelvic mass. Electronically Signed   By: Albin Felling M.D.   On: 09/02/2021 16:00   CT ABDOMEN PELVIS W CONTRAST  Result Date: 08/31/2021 CLINICAL DATA:  Abdominal pain for several weeks.  Dysuria. EXAM: CT ABDOMEN AND PELVIS WITH CONTRAST TECHNIQUE: Multidetector CT imaging of the abdomen and pelvis was performed using the standard protocol following bolus administration of intravenous contrast. RADIATION DOSE REDUCTION: This exam was performed according to the departmental dose-optimization program which includes automated exposure control, adjustment of the mA and/or kV according to patient size and/or use of iterative reconstruction technique. CONTRAST:  39m OMNIPAQUE IOHEXOL 300 MG/ML  SOLN COMPARISON:  04/30/2019 FINDINGS: Lower Chest: No acute findings. Hepatobiliary: Sub-centimeter low-attenuation lesion in the central left hepatic lobe is too small to characterize,  but stable consistent benign etiology. No new or enlarging liver lesions identified. Gallbladder is unremarkable. No evidence of biliary ductal dilatation. Pancreas:  No mass or inflammatory changes. Spleen: Within normal limits in size and appearance. Adrenals/Urinary Tract: Normal adrenal glands few benign-appearing cysts stable. Renal masses identified bilateral hydronephrosis shows significant change. Unremarkable unopacified urinary bladder. Stomach/Bowel: No  evidence of obstruction, inflammatory process or abnormal fluid collections. Vascular/Lymphatic: Abdominal retroperitoneal lymphadenopathy is seen is increased since previous study. Adenopathy in the aortocaval space measures 3.8 x 3.9 cm and is new since previous study. This encases and obstructs the IVC, and causes acute thrombosis of the right gonadal vein. Largest area of lymphadenopathy in the left paraaortic region measures 2.8 x 2.2 cm slightly increased since previous study. A new heterogeneously enhancing mass is seen involving left iliopsoas muscle encasing left external iliac vessels. This measures 7.5 x 7.3 cm on image 50/2. A new retroperitoneal mass is seen in the right presacral space which encases the right internal iliac vessels and causes osteolysis of right anterior upper sacrum. This measures 6.1 x 3.6 cm on image 55/2. Reproductive: Prior hysterectomy noted. Adnexal regions are unremarkable in appearance. Other:  None. Musculoskeletal: Osteolysis is seen involving the right anterior upper sacrum of the presacral mass described above. No other suspicious bone lesions identified. IMPRESSION: New retroperitoneal masses involving the left iliopsoas muscle and right presacral space, causing osteolysis of the right anterior upper sacrum. This is likely due to metastatic disease. Metastatic disease. New abdominal retroperitoneal lymphadenopathy, also likely to metastatic disease. This occludes the IVC, causing acute thrombosis of the right gonadal vein. Stable bilateral hydronephrosis. Electronically Signed   By: Marlaine Hind M.D.   On: 08/31/2021 17:32    Microbiology: Results for orders placed or performed during the hospital encounter of 04/30/19  Respiratory Panel by RT PCR (Flu A&B, Covid) - Nasopharyngeal Swab     Status: None   Collection Time: 04/30/19  2:38 PM   Specimen: Nasopharyngeal Swab  Result Value Ref Range Status   SARS Coronavirus 2 by RT PCR NEGATIVE NEGATIVE Final     Comment: (NOTE) SARS-CoV-2 target nucleic acids are NOT DETECTED. The SARS-CoV-2 RNA is generally detectable in upper respiratoy specimens during the acute phase of infection. The lowest concentration of SARS-CoV-2 viral copies this assay can detect is 131 copies/mL. A negative result does not preclude SARS-Cov-2 infection and should not be used as the sole basis for treatment or other patient management decisions. A negative result may occur with  improper specimen collection/handling, submission of specimen other than nasopharyngeal swab, presence of viral mutation(s) within the areas targeted by this assay, and inadequate number of viral copies (<131 copies/mL). A negative result must be combined with clinical observations, patient history, and epidemiological information. The expected result is Negative. Fact Sheet for Patients:  PinkCheek.be Fact Sheet for Healthcare Providers:  GravelBags.it This test is not yet ap proved or cleared by the [place] FDA and  has been authorized for detection and/or diagnosis of SARS-CoV-2 by FDA under an Emergency Use Authorization (EUA). This EUA will remain  in effect (meaning this test can be used) for the duration of the COVID-19 declaration under Section 564(b)(1) of the Act, 21 U.S.C. section 360bbb-3(b)(1), unless the authorization is terminated or revoked sooner.    Influenza A by PCR NEGATIVE NEGATIVE Final   Influenza B by PCR NEGATIVE NEGATIVE Final    Comment: (NOTE) The Xpert Xpress SARS-CoV-2/FLU/RSV assay is intended as an aid in  the  diagnosis of influenza from Nasopharyngeal swab specimens and  should not be used as a sole basis for treatment. Nasal washings and  aspirates are unacceptable for Xpert Xpress SARS-CoV-2/FLU/RSV  testing. Fact Sheet for Patients: PinkCheek.be Fact Sheet for Healthcare  Providers: GravelBags.it This test is not yet approved or cleared by the [place] FDA and  has been authorized for detection and/or diagnosis of SARS-CoV-2 by  FDA under an Emergency Use Authorization (EUA). This EUA will remain  in effect (meaning this test can be used) for the duration of the  Covid-19 declaration under Section 564(b)(1) of the Act, 21  U.S.C. section 360bbb-3(b)(1), unless the authorization is  terminated or revoked. Performed at [facility], [address] [place], [place] [address]     Labs: CBC: Recent Labs  Lab 08/31/21 1223 09/01/21 0529 09/03/21 0857  WBC 11.8* 10.4 11.4*  NEUTROABS  --  7.8*  --   HGB 10.1* 8.8* 9.2*  HCT 32.5* 27.9* 28.8*  MCV 90.8 88.9 89.4  PLT 432* 353 371   Basic Metabolic Panel: Recent Labs  Lab 08/31/21 1223 09/01/21 0529 09/02/21 0427  NA 130* 135 136  K 3.4* 3.7 3.6  CL 97* 102 101  CO2 '23 25 26  '$ GLUCOSE 150* 96 99  BUN 12 8 7*  CREATININE 0.63 0.46 0.46  CALCIUM 8.9 8.7* 8.9  MG  --  1.8  --    Liver Function Tests: Recent Labs  Lab 08/31/21 1223 09/01/21 0529  AST 20 14*  ALT 8 7  ALKPHOS 62 53  BILITOT 0.9 0.7  PROT 7.8 6.6  ALBUMIN 3.3* 2.5*   CBG: Recent Labs  Lab 09/02/21 1217 09/02/21 1638 09/02/21 2118 09/03/21 0718 09/03/21 1109  GLUCAP 87 95 118* 98 102*    Discharge time spent: less than 30 minutes.  Signed: Ezekiel Slocumb, DO [facility] 09/03/2021

## 2021-09-03 NOTE — TOC Transition Note (Signed)
Transition of Care [facility]) - CM/SW Discharge Note   Patient Details  Name: Brenda Bender MRN: [id number] Date of Birth: 1940-09-23  [facility]) CM/SW Contact:  Alberteen Sam, LCSW Phone Number: 09/03/2021, 12:08 PM   Clinical Narrative:     Patient to dc home today, daughter Joseph Art to assist in transport home at bedside. Agreeable to outpatient PT at [facility] in [place], referral and clinicals faxed to [phone].   Per daughter Joseph Art, outpatient PT can contact her at [phone] or patient at [phone].   Patient will use [facility] off [address] today at discharge to be abel to fill medications today due to holiday.   No further dc needs.    Final next level of care: [place] Barriers to Discharge: No Barriers Identified   Patient Goals and CMS Choice Patient states their goals for this hospitalization and ongoing recovery are:: to go home CMS Medicare.gov Compare Post Acute Care list provided to:: Patient Choice offered to / list presented to : Patient  Discharge Placement                       Discharge Plan and Services                                     Social Determinants of Health (SDOH) Interventions     Readmission Risk Interventions     No data to display

## 2021-09-04 LAB — GLUCOSE, CAPILLARY: Glucose-Capillary: 80 mg/dL (ref 70–99)

## 2021-09-06 ENCOUNTER — Encounter: Payer: Self-pay | Admitting: Oncology

## 2021-09-06 ENCOUNTER — Inpatient Hospital Stay: Payer: Medicare Other | Attending: Oncology | Admitting: Oncology

## 2021-09-06 ENCOUNTER — Inpatient Hospital Stay: Payer: Medicare Other

## 2021-09-06 VITALS — BP 102/47 | HR 63 | Temp 97.6°F | Resp 18 | Wt 135.5 lb

## 2021-09-06 DIAGNOSIS — Z7901 Long term (current) use of anticoagulants: Secondary | ICD-10-CM | POA: Insufficient documentation

## 2021-09-06 DIAGNOSIS — E43 Unspecified severe protein-calorie malnutrition: Secondary | ICD-10-CM

## 2021-09-06 DIAGNOSIS — C772 Secondary and unspecified malignant neoplasm of intra-abdominal lymph nodes: Secondary | ICD-10-CM | POA: Diagnosis not present

## 2021-09-06 DIAGNOSIS — C541 Malignant neoplasm of endometrium: Secondary | ICD-10-CM | POA: Diagnosis present

## 2021-09-06 DIAGNOSIS — C7989 Secondary malignant neoplasm of other specified sites: Secondary | ICD-10-CM | POA: Diagnosis not present

## 2021-09-06 DIAGNOSIS — D649 Anemia, unspecified: Secondary | ICD-10-CM

## 2021-09-06 DIAGNOSIS — I110 Hypertensive heart disease with heart failure: Secondary | ICD-10-CM | POA: Diagnosis not present

## 2021-09-06 DIAGNOSIS — I82890 Acute embolism and thrombosis of other specified veins: Secondary | ICD-10-CM | POA: Insufficient documentation

## 2021-09-06 DIAGNOSIS — Z7189 Other specified counseling: Secondary | ICD-10-CM

## 2021-09-06 DIAGNOSIS — I871 Compression of vein: Secondary | ICD-10-CM

## 2021-09-07 DIAGNOSIS — Z7189 Other specified counseling: Secondary | ICD-10-CM | POA: Insufficient documentation

## 2021-09-07 DIAGNOSIS — I871 Compression of vein: Secondary | ICD-10-CM | POA: Insufficient documentation

## 2021-09-07 DIAGNOSIS — E46 Unspecified protein-calorie malnutrition: Secondary | ICD-10-CM | POA: Insufficient documentation

## 2021-09-07 NOTE — Assessment & Plan Note (Signed)
Likely anemia due to chronic disease.  Normal ferritin, folate, adequate Vitamin B12.

## 2021-09-07 NOTE — Assessment & Plan Note (Signed)
Discussed with patient and family 

## 2021-09-07 NOTE — Assessment & Plan Note (Signed)
IVC obstruction and acute gonadal

## 2021-09-07 NOTE — Assessment & Plan Note (Signed)
Recommend nutritionist evaluation.  Patient would like to hold off until further discussion.

## 2021-09-07 NOTE — Assessment & Plan Note (Addendum)
Metastatic endometrioid adenocarcinoma.  Recommend PET scan to complete staging.  Refer to [facility] for evaluation. Recommend systemic platinum contained chemotherapy + immunotherapy.   The diagnosis and care plan were discussed with patient in detail.  The goal of treatment which is to palliate disease, disease related symptoms, improve quality of life and hopefully prolong life was highlighted in our discussion.  Patient is currently not interested in aggressive cancer treatments, her family would like to further discuss with patient. She wants to hold of palliative care referral for now.  If she is interested in treatment, Check MMR, HER2. [ limited tissue present for ancillary molecular testing]

## 2021-09-07 NOTE — Progress Notes (Signed)
Hematology/Oncology Progress note [phone] [phone]     Clinic Day:  09/06/2021   Referring physician: Ezekiel Slocumb, DO  ASSESSMENT & PLAN:   Assessment & Plan:  Adenocarcinoma of endometrium [facility]) Metastatic endometrioid adenocarcinoma.  Recommend PET scan to complete staging.  Refer to [facility] for evaluation. Recommend systemic platinum contained chemotherapy + immunotherapy.  Check MMR, HER2. [ limited tissue present for ancillary molecular testing] The diagnosis and care plan were discussed with patient in detail.  The goal of treatment which is to palliate disease, disease related symptoms, improve quality of life and hopefully prolong life was highlighted in our discussion.  Patient is currently not interested in aggressive cancer treatments, her family would like to further discuss with patient. She wants to hold of palliative care referral for now.    Goals of care, counseling/discussion Discussed with patient and family  Malnutrition [facility]) Recommend nutritionist evaluation.  Patient would like to hold off until further discussion.   Normocytic anemia Likely anemia due to chronic disease.  Normal ferritin, folate, adequate Vitamin B12.   Follow up TBD.  Obtain PET scan.  Refer to [facility]  The patient understands the plans discussed today and is in agreement with them.  She knows to contact our office if she develops concerns prior to her next appointment.   Earlie Server, MD  Orders Placed This Encounter  Procedures   NM PET Image Initial (PI) Skull Base To Thigh    Standing Status:   Future    Standing Expiration Date:   09/06/2022    Order Specific Question:   If indicated for the ordered procedure, I authorize the administration of a radiopharmaceutical per Radiology protocol    Answer:   Yes    Order Specific Question:   Preferred imaging location?    Answer:   [facility]   Ambulatory referral to Gynecology    Referral  Priority:   Routine    Referral Type:   Consultation    Referral Reason:   Specialty Services Required    Requested Specialty:   Gynecology    Number of Visits Requested:   1      CHIEF COMPLAINT:  Chief complaints: metastatic endometrioid adenocarcinoma  HISTORY OF PRESENT ILLNESS:  Brenda Bender is a 81 y.o. female who presents for hospital follow up for endometrioid adenocarcinoma.   Oncology History  Adenocarcinoma of endometrium ([place])  08/31/2021 Initial Diagnosis   Adenocarcinoma of endometrium  - 02/20/2020 Hysterectomy at [facility] showed  Pathology showed endometrioid adenocarcinoma, FIGO grade 2, invasive 9 mm of 28 mm endometrial thickness [22%].pT1a pNx .  She did not follow-up after surgery.  - 08/31/21 presented to ER for abdominal pain for several months. CT showed retroperitoneal mass, adenopathy. 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 - 09/02/21 CT guided biopsy of abdominopelvic soft tissue  Positive for metastatic carcinoma, compatible with known endometrioid adenocarcinoma.   -   08/31/2021 Imaging   CT abdomen pelvis with contrast showed new retroperitoneal mass involving the left iliopsoas muscle and right presacral space, causing osteolysis of the right anterior upper sacrum.  New abdominal region paratonia adenopathy.  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  Stable bilateral hydronephrosis.   09/06/2021 Cancer Staging   Staging form: Corpus Uteri - Carcinoma and Carcinosarcoma, AJCC 8th Edition - Clinical stage from 09/06/2021: FIGO Stage IVB (cTX, cN2a, cM1) - Signed by Earlie Server, MD on 09/07/2021 Stage prefix: Initial diagnosis  She presents to discuss biopsy results.  She denies any pain currently. Accompanied by daughter Brenda Bender. Daughter Brenda Bender was called during the encounter.    REVIEW OF SYSTEMS:  Review of Systems  Constitutional:  Positive for appetite change, fatigue and unexpected weight change.  Negative for chills and fever.  HENT:   Negative for hearing loss and voice change.   Eyes:  Negative for eye problems.  Respiratory:  Negative for chest tightness and cough.   Cardiovascular:  Negative for chest pain.  Gastrointestinal:  Positive for abdominal pain. Negative for abdominal distention and blood in stool.  Endocrine: Negative for hot flashes.  Genitourinary:  Negative for difficulty urinating and frequency.   Musculoskeletal:  Negative for arthralgias.  Skin:  Negative for itching and rash.  Neurological:  Negative for extremity weakness.  Hematological:  Negative for adenopathy.  Psychiatric/Behavioral:  Negative for confusion.      VITALS:  Blood pressure (!) 102/47, pulse 63, temperature 97.6 F (36.4 C), resp. rate 18, weight 135 lb 8 oz (61.5 kg).  Wt Readings from Last 3 Encounters:  09/06/21 135 lb 8 oz (61.5 kg)  09/03/21 139 lb 12.4 oz (63.4 kg)  09/07/19 166 lb 0.1 oz (75.3 kg)    Body mass index is 24.78 kg/m.  Performance status (ECOG): 2 - Symptomatic, <50% confined to bed  PHYSICAL EXAM:  Physical Exam Constitutional:      General: She is not in acute distress.    Appearance: She is not diaphoretic.  HENT:     Head: Normocephalic and atraumatic.     Nose: Nose normal.     Mouth/Throat:     Pharynx: No oropharyngeal exudate.  Eyes:     General: No scleral icterus.    Pupils: Pupils are equal, round, and reactive to light.  Cardiovascular:     Rate and Rhythm: Normal rate and regular rhythm.     Heart sounds: No murmur heard. Pulmonary:     Effort: Pulmonary effort is normal. No respiratory distress.     Breath sounds: No rales.  Chest:     Chest wall: No tenderness.  Abdominal:     General: Bowel sounds are normal.     Palpations: Abdomen is soft.  Musculoskeletal:        General: Normal range of motion.     Cervical back: Normal range of motion and neck supple.  Skin:    General: Skin is warm and dry.     Findings: No erythema.   Neurological:     General: No focal deficit present.     Mental Status: She is alert and oriented to person, place, and time.     Cranial Nerves: No cranial nerve deficit.     Motor: No abnormal muscle tone.  Psychiatric:        Mood and Affect: Mood and affect normal.     LABS:      Latest Ref Rng & Units 09/03/2021    8:57 AM 09/01/2021    5:29 AM 08/31/2021   12:23 PM  CBC  WBC 4.0 - 10.5 K/uL 11.4  10.4  11.8   Hemoglobin 12.0 - 15.0 g/dL 9.2  8.8  10.1   Hematocrit 36.0 - 46.0 % 28.8  27.9  32.5   Platelets 150 - 400 K/uL 386  353  432       Latest Ref Rng & Units 09/02/2021    4:27 AM 09/01/2021    5:29 AM 08/31/2021   12:23 PM  CMP  Glucose 70 - 99 mg/dL 99  96  150   BUN 8 - 23 mg/dL 7  8  12    Creatinine 0.44 - 1.00 mg/dL 0.46  0.46  0.63   Sodium 135 - 145 mmol/L 136  135  130   Potassium 3.5 - 5.1 mmol/L 3.6  3.7  3.4   Chloride 98 - 111 mmol/L 101  102  97   CO2 22 - 32 mmol/L 26  25  23    Calcium 8.9 - 10.3 mg/dL 8.9  8.7  8.9   Total Protein 6.5 - 8.1 g/dL  6.6  7.8   Total Bilirubin 0.3 - 1.2 mg/dL  0.7  0.9   Alkaline Phos 38 - 126 U/L  53  62   AST 15 - 41 U/L  14  20   ALT 0 - 44 U/L  7  8     Lab Results  Component Value Date   TIBC 174 (L) 09/01/2021   FERRITIN 71 09/01/2021   IRONPCTSAT 12 09/01/2021   No results found for: "LDH"  STUDIES:  CT ABDOMINAL MASS BIOPSY  Result Date: 09/02/2021 INDICATION: Mass EXAM: CT-guided core needle biopsy of abdominopelvic mass MEDICATIONS: None. ANESTHESIA/SEDATION: Moderate (conscious) sedation was employed during this procedure. A total of Versed 1 mg and Fentanyl 50 mcg was administered intravenously. Moderate Sedation Time: 12 minutes. The patient's level of consciousness and vital signs were monitored continuously by radiology nursing throughout the procedure under my direct supervision. FLUOROSCOPY TIME:  Fluoroscopy Time: N/a COMPLICATIONS: None immediate. PROCEDURE: Informed written consent was obtained from  the patient after a thorough discussion of the procedural risks, benefits and alternatives. All questions were addressed. Maximal Sterile Barrier Technique was utilized including caps, mask, sterile gowns, sterile gloves, sterile drape, hand hygiene and skin antiseptic. A timeout was performed prior to the initiation of the procedure. The patient was placed supine on the exam table. Limited CT of the abdomen and pelvis was performed for planning purposes. This demonstrated multiple masses consistent with recent CT. A left lower abdominopelvic mass was selected as appropriate for biopsy. Skin entry site was marked, and the overlying skin was prepped and draped in the standard sterile fashion. Local analgesia was obtained with 1% lidocaine. Using intermittent CT fluoroscopy, a 17 gauge introducer needle was advanced towards the identified lesion. Subsequently, core needle biopsy was performed using an 18 gauge core biopsy device x4 total passes. Specimens were submitted in formalin to pathology for further handling. Limited postprocedure imaging demonstrated no complicating feature. The patient tolerated the procedure well, and was transferred to recovery in stable condition. IMPRESSION: Successful CT-guided core needle biopsy of abdominopelvic mass. Electronically Signed   By: Albin Felling M.D.   On: 09/02/2021 16:00   CT ABDOMEN PELVIS W CONTRAST  Result Date: 08/31/2021 CLINICAL DATA:  Abdominal pain for several weeks.  Dysuria. EXAM: CT ABDOMEN AND PELVIS WITH CONTRAST TECHNIQUE: Multidetector CT imaging of the abdomen and pelvis was performed using the standard protocol following bolus administration of intravenous contrast. RADIATION DOSE REDUCTION: This exam was performed according to the departmental dose-optimization program which includes automated exposure control, adjustment of the mA and/or kV according to patient size and/or use of iterative reconstruction technique. CONTRAST:  15m OMNIPAQUE  IOHEXOL 300 MG/ML  SOLN COMPARISON:  04/30/2019 FINDINGS: Lower Chest: No acute findings. Hepatobiliary: Sub-centimeter low-attenuation lesion in the central left hepatic lobe is too small to characterize, but stable consistent benign etiology. No new or enlarging liver  lesions identified. Gallbladder is unremarkable. No evidence of biliary ductal dilatation. Pancreas:  No mass or inflammatory changes. Spleen: Within normal limits in size and appearance. Adrenals/Urinary Tract: Normal adrenal glands few benign-appearing cysts stable. Renal masses identified bilateral hydronephrosis shows significant change. Unremarkable unopacified urinary bladder. Stomach/Bowel: No evidence of obstruction, inflammatory process or abnormal fluid collections. Vascular/Lymphatic: Abdominal retroperitoneal lymphadenopathy is seen is increased since previous study. Adenopathy in the aortocaval space measures 3.8 x 3.9 cm and is new since previous study. This encases and obstructs the IVC, and causes acute thrombosis of the right gonadal vein. Largest area of lymphadenopathy in the left paraaortic region measures 2.8 x 2.2 cm slightly increased since previous study. A new heterogeneously enhancing mass is seen involving left iliopsoas muscle encasing left external iliac vessels. This measures 7.5 x 7.3 cm on image 50/2. A new retroperitoneal mass is seen in the right presacral space which encases the right internal iliac vessels and causes osteolysis of right anterior upper sacrum. This measures 6.1 x 3.6 cm on image 55/2. Reproductive: Prior hysterectomy noted. Adnexal regions are unremarkable in appearance. Other:  None. Musculoskeletal: Osteolysis is seen involving the right anterior upper sacrum of the presacral mass described above. No other suspicious bone lesions identified. IMPRESSION: New retroperitoneal masses involving the left iliopsoas muscle and right presacral space, causing osteolysis of the right anterior upper sacrum.  This is likely due to metastatic disease. Metastatic disease. New abdominal retroperitoneal lymphadenopathy, also likely to metastatic disease. This occludes the IVC, causing acute thrombosis of the right gonadal vein. Stable bilateral hydronephrosis. Electronically Signed   By: Marlaine Hind M.D.   On: 08/31/2021 17:32      HISTORY:   Past Medical History:  Diagnosis Date   Arthritis    CHF (congestive heart failure) (HCC)    Diabetes mellitus without complication (HCC)    HLD (hyperlipidemia)    Hypertension     Past Surgical History:  Procedure Laterality Date   VENTRAL HERNIA REPAIR N/A 04/30/2019   Procedure: HERNIA REPAIR VENTRAL ADULT;  Surgeon: Jules Husbands, MD;  Location: [facility] ORS;  Service: General;  Laterality: N/A;    Family History  Problem Relation Age of Onset   Diabetes Mother    Endometrial cancer Neg Hx     Social History:  reports that she has never smoked. She has never used smokeless tobacco. She reports that she does not drink alcohol and does not use drugs.  Allergies:  Allergies  Allergen Reactions   Penicillins Nausea And Vomiting    Current Medications: Current Outpatient Medications  Medication Sig Dispense Refill   acetaminophen (TYLENOL) 325 MG tablet Take 2 tablets (650 mg total) by mouth every 6 (six) hours as needed for mild pain, headache or fever (or Fever >/= 101).     amLODipine (NORVASC) 5 MG tablet Take 1 tablet (5 mg total) by mouth daily. 30 tablet 1   apixaban (ELIQUIS) 5 MG TABS tablet Take 2 tablets (10 mg total) by mouth 2 (two) times daily for 7 days, THEN 1 tablet (5 mg total) 2 (two) times daily. 60 tablet 0   aspirin EC 81 MG tablet Take 81 mg by mouth daily.     atorvastatin (LIPITOR) 40 MG tablet Take 40 mg by mouth daily.     cetirizine (ZYRTEC) 10 MG tablet Take 10 mg by mouth daily.     fluticasone (FLONASE) 50 MCG/ACT nasal spray Place 1 spray into both nostrils daily.     HYDROcodone-acetaminophen (NORCO/VICODIN)  5-325  MG tablet Take 1 tablet by mouth every 6 (six) hours as needed (for pain not controlled by Tylenol). 20 tablet 0   JANUVIA 25 MG tablet Take 25 mg by mouth daily.     losartan (COZAAR) 100 MG tablet Take 100 mg by mouth daily.     metoprolol tartrate (LOPRESSOR) 50 MG tablet Take 50 mg by mouth 2 (two) times daily.     ondansetron (ZOFRAN) 4 MG tablet Take 1 tablet (4 mg total) by mouth every 6 (six) hours as needed for nausea. 20 tablet 0   polyethylene glycol (MIRALAX / GLYCOLAX) 17 g packet Take 17 g by mouth daily as needed for mild constipation. 14 each 0   vitamin B-12 (CYANOCOBALAMIN) 1000 MCG tablet Take 1,000 mcg by mouth once a week.     No current facility-administered medications for this visit.

## 2021-09-11 ENCOUNTER — Telehealth: Payer: Self-pay | Admitting: Oncology

## 2021-09-11 ENCOUNTER — Other Ambulatory Visit: Payer: Self-pay | Admitting: Oncology

## 2021-09-11 MED ORDER — HYDROCODONE-ACETAMINOPHEN 10-325 MG PO TABS
1.0000 | ORAL_TABLET | Freq: Four times a day (QID) | ORAL | 0 refills | Status: DC | PRN
Start: 2021-09-11 — End: 2021-10-24

## 2021-09-11 NOTE — Telephone Encounter (Signed)
Patient's daughter reports that patient's pain is not controlled with Norco 5/325 every 4 hours I called patient and daughter advised patient to increase Norco to 10/325 mg every 4-6 hours.  Prescription sent to [facility].

## 2021-09-13 ENCOUNTER — Telehealth: Payer: Self-pay

## 2021-09-13 NOTE — Telephone Encounter (Signed)
Per secure chat from Malon Kindle, RT: I just spoke to this pt to remind her of her 10:30 am PET Scan on 09/16/21. She says she is not coming Monday, not doing the PET Scan & doesn't want to r/s. I have cancelled her appt & dose for 09/16/21

## 2021-09-16 NOTE — Telephone Encounter (Signed)
Brenda Bender- FYI, since she will be seeing gyn onc on Wed.   Spoke to pt's daughter, Brenda Bender, and asked if she wanted to r/s the PET scan. Per [name], Brenda Bender doesn't want to proceed with PET Scan.   Brenda Bender voiced concern that pt has been restless at night and is wanting to know if something can be prescribed at night, to calm her down.

## 2021-09-18 ENCOUNTER — Ambulatory Visit: Payer: Medicare Other

## 2021-09-18 ENCOUNTER — Inpatient Hospital Stay (HOSPITAL_BASED_OUTPATIENT_CLINIC_OR_DEPARTMENT_OTHER): Payer: Medicare Other | Admitting: Obstetrics and Gynecology

## 2021-09-18 ENCOUNTER — Encounter: Payer: Self-pay | Admitting: Obstetrics and Gynecology

## 2021-09-18 VITALS — BP 128/64 | HR 97 | Temp 98.7°F | Resp 20 | Wt 131.9 lb

## 2021-09-18 DIAGNOSIS — C541 Malignant neoplasm of endometrium: Secondary | ICD-10-CM

## 2021-09-18 MED ORDER — MEGESTROL ACETATE 40 MG PO TABS: 80.0000 mg | ORAL_TABLET | Freq: Every day | ORAL | 3 refills | Status: DC

## 2021-09-18 NOTE — Progress Notes (Signed)
Gynecologic Oncology Consult Visit   Referring Provider: Dr Tasia Catchings  Chief Concern: Recurrent endometrial cancer  Subjective:  Brenda Bender is a 81 y.o. female who is seen in consultation from Dr. Duard Larsen for recurrent endometrial cancer.  She had incarcerated ventral hernia with small bowel obstruction and underwent bowel resection/reanastomosis and hernia repair on 04/30/19.   Referred by Dr Leafy Ro to [facility] for stress urinary incontinence 3/21. In 12/21 she had Vaginectomy, Cystoscopy, TVT Sling, Vaginal Hysterectomy, Anterior, Posterior and Enterocele repair with Dr Derrill Kay at [facility].  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  No LVSI.  Adnexa were not removed.  Referred to [facility], but not seen.    08/31/21 presented to ER for abdominal pain for several months. CT abdomen pelvis with contrast showed new retroperitoneal mass involving the left iliopsoas muscle and right presacral space, causing osteolysis of the right anterior upper sacrum.  New abdominal region paratonia adenopathy.   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  - 09/02/21 CT guided biopsy of abdominopelvic soft tissue positive for metastatic carcinoma, compatible with known endometrioid adenocarcinoma.   Retrospective review shows CT scan in 2/21 at the time of a partial SBO related to hernia showed 2.7 cm left aortic lymph node concerning for cancer.  Ventral hernia repair 04/30/19.   CT chest 09/07/19 showed concern for lytic lesions involving body of the sternum.  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)    Acute deep vein thrombosis (DVT) of pelvic vein     Adenocarcinoma of endometrium ([place]) 08/31/2021   Essential hypertension 08/31/2021   Diabetes mellitus without complication ([place]) 01/75/1025   Deep venous thrombosis ([place]) 08/31/2021   Hyponatremia 08/31/2021   Hypokalemia 08/31/2021   Type 2 diabetes mellitus with hyperglycemia, without long-term current use of insulin ([place]) 08/31/2021   Normocytic anemia 08/31/2021   Mixed diabetic hyperlipidemia associated with type 2 diabetes mellitus ([place]) 08/31/2021   Prolapsed, uterovaginal, complete 02/20/2020   Mixed hyperlipidemia 09/06/2019   Seasonal allergic rhinitis due to pollen 09/06/2019   Incarcerated ventral hernia 04/30/2019    Past Medical History: Past Medical History:  Diagnosis Date   Arthritis    CHF (congestive heart failure) ([place])    Diabetes mellitus without complication ([place])    HLD (hyperlipidemia)    Hypertension     Past Surgical History: Past Surgical History:  Procedure Laterality Date   VENTRAL HERNIA REPAIR N/A 04/30/2019   Procedure: HERNIA REPAIR VENTRAL ADULT;  Surgeon: Jules Husbands, MD;  Location: [facility] ORS;  Service: General;  Laterality: N/A;      OB History:  OB History  Gravida Para Term Preterm AB Living  11            SAB IAB Ectopic Multiple Live Births               # Outcome Date GA Lbr Len/2nd Weight Sex Delivery Anes PTL Lv  11 [place]           10 [place]           9 [place]           8 [place]  7 Gravida           6 Grav[place]           5 Grav[place]           4 [place]           3 [place]           2 [place]           1 [place]             Family History: Family History  Problem Relation Age of Onset   Diabetes Mother    Endometrial cancer Neg Hx     Social History: Social History   Socioeconomic History   Marital status: Widowed    Spouse name: Not on file   Number of children: Not on file   Years of education: Not on file   Highest education level: Not on file  Occupational History   Not on file   Tobacco Use   Smoking status: Never   Smokeless tobacco: Never  Substance and Sexual Activity   Alcohol use: No   Drug use: No   Sexual activity: Not Currently  Other Topics Concern   Not on file  Social History Narrative   Not on file   Social Determinants of Health   Financial Resource Strain: Not on file  Food Insecurity: Not on file  Transportation Needs: Not on file  Physical Activity: Not on file  Stress: Not on file  Social Connections: Not on file  Intimate Partner Violence: Not on file    Allergies: Allergies  Allergen Reactions   Penicillins Nausea And Vomiting    Current Medications: Current Outpatient Medications  Medication Sig Dispense Refill   acetaminophen (TYLENOL) 325 MG tablet Take 2 tablets (650 mg total) by mouth every 6 (six) hours as needed for mild pain, headache or fever (or Fever >/= 101).     amLODipine (NORVASC) 5 MG tablet Take 1 tablet (5 mg total) by mouth daily. 30 tablet 1   apixaban (ELIQUIS) 5 MG TABS tablet Take 2 tablets (10 mg total) by mouth 2 (two) times daily for 7 days, THEN 1 tablet (5 mg total) 2 (two) times daily. 60 tablet 0   aspirin EC 81 MG tablet Take 81 mg by mouth daily.     atorvastatin (LIPITOR) 40 MG tablet Take 40 mg by mouth daily.     cetirizine (ZYRTEC) 10 MG tablet Take 10 mg by mouth daily.     fluticasone (FLONASE) 50 MCG/ACT nasal spray Place 1 spray into both nostrils daily.     HYDROcodone-acetaminophen (NORCO) 10-325 MG tablet Take 1 tablet by mouth every 6 (six) hours as needed for moderate pain or severe pain. 60 tablet 0   JANUVIA 25 MG tablet Take 25 mg by mouth daily.     losartan (COZAAR) 100 MG tablet Take 100 mg by mouth daily.     metoprolol tartrate (LOPRESSOR) 50 MG tablet Take 50 mg by mouth 2 (two) times daily.     ondansetron (ZOFRAN) 4 MG tablet Take 1 tablet (4 mg total) by mouth every 6 (six) hours as needed for nausea. 20 tablet 0   polyethylene glycol (MIRALAX / GLYCOLAX) 17 g packet  Take 17 g by mouth daily as needed for mild constipation. 14 each 0   vitamin B-12 (CYANOCOBALAMIN) 1000 MCG tablet Take 1,000 mcg by mouth once a week.     No current facility-administered medications for this visit.  Review of Systems General: negative for, fevers, chills, fatigue, changes in sleep, changes in weight or appetite Skin: negative for changes in color, texture, moles or lesions Eyes: negative for, changes in vision, pain, diplopia HEENT: negative for, change in hearing, pain, discharge, tinnitus, vertigo, voice changes, sore throat, neck masses Breasts: negative for breast lumps Pulmonary: negative for, dyspnea, orthopnea, productive cough Cardiac: negative for, palpitations, syncope, pain, discomfort, pressure Gastrointestinal: negative for, dysphagia, nausea, vomiting, jaundice, pain, constipation, diarrhea, hematemesis, hematochezia Genitourinary/Sexual: negative for, dysuria, discharge, hesitancy, nocturia, retention, stones, infections, STD's, incontinence Musculoskeletal: negative for, pain, stiffness, swelling, range of motion limitation Hematology: negative for, easy bruising, bleeding Neurologic/Psych: negative for, headaches, seizures, paralysis, weakness, tremor, change in gait, change in sensation, mood swings, depression, anxiety, change in memory  Objective:  Physical Examination:  BP 128/64   Pulse 97   Temp 98.7 F (37.1 C)   Resp 20   Wt 131 lb 14.4 oz (59.8 kg)   SpO2 100%   BMI 24.12 kg/m    ECOG Performance Status: 1 - Symptomatic but completely ambulatory  General appearance: alert, cooperative, and appears stated age HEENT:extra ocular movement intact, neck supple with midline trachea, and thyroid without masses Lymph node survey: non-palpable, axillary, inguinal, supraclavicular Cardiovascular: regular rate and rhythm Respiratory: normal air entry, lungs clear to auscultation Abdomen: soft, non-tender, without masses or organomegaly, no  hernias, and well healed incision Back: inspection of back is normal Extremities: extremities normal, atraumatic, no cyanosis or edema Skin exam - normal coloration and turgor, no rashes, no suspicious skin lesions noted. Neurological exam reveals alert, oriented, normal speech, no focal findings or movement disorder noted.  Pelvic: Deferred  Lab Review Lab Results  Component Value Date   WBC 11.4 (H) 09/03/2021   HGB 9.2 (L) 09/03/2021   HCT 28.8 (L) 09/03/2021   MCV 89.4 09/03/2021   PLT 386 09/03/2021     Chemistry      Component Value Date/Time   NA 136 09/02/2021 0427   K 3.6 09/02/2021 0427   CL 101 09/02/2021 0427   CO2 26 09/02/2021 0427   BUN 7 (L) 09/02/2021 0427   CREATININE 0.46 09/02/2021 0427      Component Value Date/Time   CALCIUM 8.9 09/02/2021 0427   ALKPHOS 53 09/01/2021 0529   AST 14 (L) 09/01/2021 0529   ALT 7 09/01/2021 0529   BILITOT 0.7 09/01/2021 0529        Radiologic Imaging: CT 04/30/19 FINDINGS: Lower chest: The lung bases are clear. The heart size is normal.   Hepatobiliary: The liver is normal. Normal gallbladder.There is no biliary ductal dilation.   Pancreas: Normal contours without ductal dilatation. No peripancreatic fluid collection.   Spleen: No splenic laceration or hematoma.   Adrenals/Urinary Tract:   --Adrenal glands: No adrenal hemorrhage.   --Right kidney/ureter: There is mild-to-moderate right-sided hydroureteronephrosis without evidence for an obstructing stone. There is some narrowing of the distal right ureter (axial series 2, image 56). There is no surrounding mass at this location. Beyond this location, the ureter again appears somewhat dilated prior to entering the urinary bladder.   --Left kidney/ureter: There is mild to moderate left-sided hydroureteronephrosis without evidence for an obstructing stone. There is some narrowing of the midportion of the ureter (axial series 2, image 47). There is no  surrounding mass.   --Urinary bladder: The bladder is unremarkable aside from evidence for a cystocele.   Stomach/Bowel:   --Stomach/Duodenum: The stomach is distended.   --Small bowel: There  is a moderate to high-grade small bowel obstruction secondary to an upper ventral wall hernia containing a loop of small bowel. The loop of small bowel within this hernia demonstrates mucosal enhancement with a trace amount of adjacent free fluid. There is no adjacent free air. Distal to this loop of small bowel, the small bowel is decompressed.   --Colon: There is a rectocele. The colon is relatively decompressed.   --Appendix: Normal.   Vascular/Lymphatic: Atherosclerotic calcification is present within the non-aneurysmal abdominal aorta, without hemodynamically significant stenosis.   --there is a fluid density retroperitoneal structure measuring approximately 2.7 by 1.9 cm (axial series 2, image 39). This does not appear to connect to the nearby duodenum, especially on the sagittal images. There are few prominent retroperitoneal lymph nodes that are non pathologically enlarged.   --No mesenteric lymphadenopathy.   --No pelvic or inguinal lymphadenopathy.   Reproductive: The uterus is moderately heterogeneous and slightly enlarged for a patient of this age. The patient is status post prior bilateral tubal ligation.   Other: There is a small amount of free fluid in the patient's abdomen and pelvis. There is significant pelvic floor laxity with prolapse of the bladder, rectum, and uterus.   Musculoskeletal. No acute displaced fractures.   IMPRESSION: 1. Moderate to high-grade small bowel obstruction secondary to a small bowel containing ventral wall hernia. There is a small amount of free fluid adjacent to the herniated bowel loop raising concern for incarceration/strangulation. Surgical consultation is recommended. 2. Small amount of free fluid in the abdomen and pelvis,  likely reactive. 3. Significant pelvic floor laxity with prolapse of the bladder, rectum, and uterus. 4. Mild to moderate bilateral hydroureteronephrosis without evidence for an obstructing stone. This appearance may be secondary to the presence of a cystocele. 5. Fluid density 2.7 cm structure in the retroperitoneum as detailed above. This is highly concerning for a necrotic pathologically enlarged lymph node. A duodenal diverticulum is another possibility, however a clear connection to the adjacent duodenum is not identified on this study. Follow-up is recommended. 6.  Aortic Atherosclerosis (ICD10-I70.0).   CT scan 08/31/21 FINDINGS: Lower Chest: No acute findings.   Hepatobiliary: Sub-centimeter low-attenuation lesion in the central left hepatic lobe is too small to characterize, but stable consistent benign etiology. No new or enlarging liver lesions identified. Gallbladder is unremarkable. No evidence of biliary ductal dilatation.   Pancreas:  No mass or inflammatory changes.   Spleen: Within normal limits in size and appearance.   Adrenals/Urinary Tract: Normal adrenal glands few benign-appearing cysts stable. Renal masses identified bilateral hydronephrosis shows significant change. Unremarkable unopacified urinary bladder.   Stomach/Bowel: No evidence of obstruction, inflammatory process or abnormal fluid collections.   Vascular/Lymphatic: Abdominal retroperitoneal lymphadenopathy is seen is increased since previous study. Adenopathy in the aortocaval space measures 3.8 x 3.9 cm and is new since previous study. This encases and obstructs the IVC, and causes acute thrombosis of the right gonadal vein. Largest area of lymphadenopathy in the left paraaortic region measures 2.8 x 2.2 cm slightly increased since previous study.   A new heterogeneously enhancing mass is seen involving left iliopsoas muscle encasing left external iliac vessels. This measures 7.5 x 7.3 cm  on image 50/2. A new retroperitoneal mass is seen in the right presacral space which encases the right internal iliac vessels and causes osteolysis of right anterior upper sacrum. This measures 6.1 x 3.6 cm on image 55/2.   Reproductive: Prior hysterectomy noted. Adnexal regions are unremarkable in appearance.   Other:  None.   Musculoskeletal: Osteolysis is seen involving the right anterior upper sacrum of the presacral mass described above. No other suspicious bone lesions identified.   IMPRESSION: New retroperitoneal masses involving the left iliopsoas muscle and right presacral space, causing osteolysis of the right anterior upper sacrum. This is likely due to metastatic disease. Metastatic disease.   New abdominal retroperitoneal lymphadenopathy, also likely to metastatic disease. This occludes the IVC, causing acute thrombosis of the right gonadal vein.   Stable bilateral hydronephrosis.    Assessment:  Brenda Bender is a 81 y.o. female diagnosed with recurrent endometrial cancer.  She had unexpected finding of grade 2 endometrial cancer in 12/21 at the time of Urogyn surgery for SUI that included hysterectomy.  The ovaries were not removed.  Patient was referred to [facility], but did not follow through with the appointment.  Now with extensive nodal disease found after having a CT scan in the ED 08/31/21 when being seen for evaluation of pain.  CT directed biopsy confirms recurrent endometrial cancer.  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  She takes oxycodone/tylenol for pain relief.    Medical co-morbidities complicating care: HTN.  Plan:   Problem List Items Addressed This Visit       Genitourinary   Adenocarcinoma of endometrium ([place]) - Primary (Chronic)   We discussed treatment options with the patient and daughter for management including carboplatinum/taxol chemotherapy with pembrolizumab, radiation and hormonal therapy.  She is very reluctant to  take any cancer treatment presently, despite being told that the cancer would likely soon cause more pain and be life threatening and that available treatments can dramatically shrink the cancer.  Her daughter also urges her to take treatment. We explained that radiation was painless, but she was not willing to see Dr Garner Nash.  She eventually agreed to try Megace 80 mg qd and to return for CT scan in 3 months to assess for response.  We will also order IHC for ER/PR and MMR protein expression.  The patient's diagnosis, an outline of the further diagnostic and laboratory studies which will be required, the recommendation, and alternatives were discussed.  All questions were answered to the patient's satisfaction.  A total of 60 minutes were spent with the patient/family today; 75% was spent in education, counseling and coordination of care for endometrial cancer.    Mellody Drown, MD  CC:  Wynonia Sours, PA [employer] [place],  [place] [phone]

## 2021-09-18 NOTE — Progress Notes (Signed)
ER/PR, MMR IHC requested on specimen [id number], collected 09/02/2021, abdominopelvic mass.

## 2021-10-07 ENCOUNTER — Encounter: Payer: Self-pay | Admitting: Oncology

## 2021-10-07 LAB — SURGICAL PATHOLOGY

## 2021-10-11 ENCOUNTER — Telehealth: Payer: Self-pay | Admitting: *Deleted

## 2021-10-11 NOTE — Telephone Encounter (Signed)
Daughter Brenda Bender called reporting that patient is having abdominal pain and bleeding bright red every time she uses the bathroom, thinks it may be vaginal but not sure. It started today she thinks. She states that the patient has been having abdominal pian for a while. She is wanting to bring patient to office now to be checked out. Please advise

## 2021-10-14 ENCOUNTER — Telehealth: Payer: Self-pay | Admitting: *Deleted

## 2021-10-14 NOTE — Telephone Encounter (Signed)
Called the home phone number on the pt's list. It was her daughter and she said that she has been eating and drinking good. She was with her from Friday to Monday. She did not see bleeding at all. She did say that her stool was tarry. She is on iron pills per the family. They will call back if she has any other problems., I told her to call the main number and ask  for Lauren to get the message because she is the only one here mon to Friday. GYN staff here on only wed.. ok with family member good with this

## 2021-10-15 ENCOUNTER — Other Ambulatory Visit: Payer: Self-pay | Admitting: Nurse Practitioner

## 2021-10-15 ENCOUNTER — Other Ambulatory Visit: Payer: Self-pay | Admitting: *Deleted

## 2021-10-15 ENCOUNTER — Telehealth: Payer: Self-pay | Admitting: *Deleted

## 2021-10-15 DIAGNOSIS — I871 Compression of vein: Secondary | ICD-10-CM

## 2021-10-15 DIAGNOSIS — G893 Neoplasm related pain (acute) (chronic): Secondary | ICD-10-CM

## 2021-10-15 DIAGNOSIS — C7989 Secondary malignant neoplasm of other specified sites: Secondary | ICD-10-CM

## 2021-10-15 NOTE — Telephone Encounter (Signed)
I received a call from answering service that [name] who I do not see on the patient HIPPA form called reporting that patient Is in pain and the medicine she has is not working and that it is an emergency that we call her. I got message that voice mail is not set up when I tried to call patient/ Brenda Bender who left patient number to call so I could not speak to either of them

## 2021-10-15 NOTE — Progress Notes (Signed)
Patient has chronic pain unrelieved by norco. Suspect progressive malignancy given worsening pain and recent bleeding. Previously declined chemo or radiation. Spoke to Logan, RN who will move up scans and have patient follow up with Dr. Tasia Catchings for results. She will see Billey Chang, NP with palliative care for ongoing management.   Unable to send script electronically d/t [employer] outage. Can send tomorrow or print prescription and patient to pick up. Judeen Hammans to call patient's daughter.

## 2021-10-16 ENCOUNTER — Inpatient Hospital Stay: Payer: Medicare Other | Attending: Oncology | Admitting: Hospice and Palliative Medicine

## 2021-10-16 ENCOUNTER — Other Ambulatory Visit: Payer: Self-pay | Admitting: *Deleted

## 2021-10-16 DIAGNOSIS — C7989 Secondary malignant neoplasm of other specified sites: Secondary | ICD-10-CM

## 2021-10-16 MED ORDER — SENNA 8.6 MG PO TABS: 1.0000 | ORAL_TABLET | Freq: Every day | ORAL | 0 refills | Status: AC | PRN

## 2021-10-16 MED ORDER — MORPHINE SULFATE ER 15 MG PO TBCR: 15.0000 mg | EXTENDED_RELEASE_TABLET | Freq: Two times a day (BID) | ORAL | 0 refills | Status: AC

## 2021-10-16 NOTE — Progress Notes (Signed)
Pt wants hospice and cancelled the ct scan for auguest

## 2021-10-16 NOTE — Progress Notes (Unsigned)
Virtual Visit via Telephone Note  I connected with Brenda Bender on 10/16/21 at 10:00 AM EDT by telephone and verified that I am speaking with the correct person using two identifiers.  Location: Patient: Home Provider: Clinic   I discussed the limitations, risks, security and privacy concerns of performing an evaluation and management service by telephone and the availability of in person appointments. I also discussed with the patient that there may be a patient responsible charge related to this service. The patient expressed understanding and agreed to proceed.   History of Present Illness: Ms. Brenda Bender is an 81 year old woman with multiple medical problems including history of diabetes, hypertension, hyperlipidemia, and endometrial cancer status post hysterectomy December 2021.  She was subsequently lost to follow-up.  Patient was hospitalized 08/31/2021 to 09/03/2021 with abdominal pain, weight loss.  CT of the abdomen and pelvis revealed a new retroperitoneal masses and gonadal vein thrombosis concerning for recurrent metastatic disease.  Patient underwent CT-guided biopsy confirming recurrent endometrial cancer.  She subsequently had outpatient follow-up with both medical oncology and GYN oncology.  Patient was reluctant to proceed with any chemotherapy or radiation but did agree to try Megace and repeat scans in 3 months.   Observations/Objective: I called and spoke with patient and daughter [name]) by phone.  Patient has had worsening generalized pain over the past several weeks.  She has been taking Norco every 6 hours around-the-clock and finds that helpful but short-lived.  Assessment and Plan:   Follow Up Instructions:    I discussed the assessment and treatment plan with the patient. The patient was provided an opportunity to ask questions and all were answered. The patient agreed with the plan and demonstrated an understanding of the instructions.   The patient was advised  to call back or seek an in-person evaluation if the symptoms worsen or if the condition fails to improve as anticipated.  I provided *** minutes of non-face-to-face time during this encounter.   Irean Hong, NP

## 2021-10-16 NOTE — Telephone Encounter (Signed)
See separate note by Alease Medina, NP

## 2021-10-18 ENCOUNTER — Telehealth: Payer: Self-pay | Admitting: *Deleted

## 2021-10-18 NOTE — Telephone Encounter (Signed)
Brenda Bender with hospice called to report that patient was admitted to hospice services and some medicine adjustments were made. Per family, the MS Contin is controlling her pain, but she is sleeping all the time so Dr Gilford Rile has changed the directions to 1 tablet at hs and for her to use the Hydrocodone during day for BTP. If this does not work, they will call you for further orders.

## 2021-10-18 NOTE — Telephone Encounter (Signed)
Ok. Thank you.

## 2021-10-24 ENCOUNTER — Other Ambulatory Visit: Payer: Self-pay | Admitting: Hospice and Palliative Medicine

## 2021-10-24 MED ORDER — HYDROCODONE-ACETAMINOPHEN 10-325 MG PO TABS
1.0000 | ORAL_TABLET | ORAL | 0 refills | Status: AC | PRN
Start: 2021-10-24 — End: ?

## 2021-10-24 NOTE — Progress Notes (Signed)
Rx for Norco sent to pharmacy per request of hospice nurse Lorriane Shire)

## 2021-10-28 ENCOUNTER — Other Ambulatory Visit: Payer: Self-pay

## 2021-10-28 DIAGNOSIS — Z7982 Long term (current) use of aspirin: Secondary | ICD-10-CM | POA: Insufficient documentation

## 2021-10-28 DIAGNOSIS — I509 Heart failure, unspecified: Secondary | ICD-10-CM | POA: Diagnosis not present

## 2021-10-28 DIAGNOSIS — Z8542 Personal history of malignant neoplasm of other parts of uterus: Secondary | ICD-10-CM | POA: Diagnosis not present

## 2021-10-28 DIAGNOSIS — R109 Unspecified abdominal pain: Secondary | ICD-10-CM | POA: Insufficient documentation

## 2021-10-28 DIAGNOSIS — D649 Anemia, unspecified: Secondary | ICD-10-CM | POA: Insufficient documentation

## 2021-10-28 DIAGNOSIS — I110 Hypertensive heart disease with heart failure: Secondary | ICD-10-CM | POA: Diagnosis not present

## 2021-10-28 DIAGNOSIS — E871 Hypo-osmolality and hyponatremia: Secondary | ICD-10-CM | POA: Diagnosis not present

## 2021-10-28 DIAGNOSIS — M549 Dorsalgia, unspecified: Secondary | ICD-10-CM | POA: Diagnosis not present

## 2021-10-28 DIAGNOSIS — D72829 Elevated white blood cell count, unspecified: Secondary | ICD-10-CM | POA: Diagnosis not present

## 2021-10-28 DIAGNOSIS — E119 Type 2 diabetes mellitus without complications: Secondary | ICD-10-CM | POA: Insufficient documentation

## 2021-10-28 DIAGNOSIS — G893 Neoplasm related pain (acute) (chronic): Secondary | ICD-10-CM | POA: Diagnosis not present

## 2021-10-28 DIAGNOSIS — Z79899 Other long term (current) drug therapy: Secondary | ICD-10-CM | POA: Diagnosis not present

## 2021-10-28 DIAGNOSIS — Z20822 Contact with and (suspected) exposure to covid-19: Secondary | ICD-10-CM | POA: Insufficient documentation

## 2021-10-28 DIAGNOSIS — Z7984 Long term (current) use of oral hypoglycemic drugs: Secondary | ICD-10-CM | POA: Diagnosis not present

## 2021-10-28 LAB — CBC
HCT: 27.4 % — ABNORMAL LOW (ref 36.0–46.0)
Hemoglobin: 8.3 g/dL — ABNORMAL LOW (ref 12.0–15.0)
MCH: 25.8 pg — ABNORMAL LOW (ref 26.0–34.0)
MCHC: 30.3 g/dL (ref 30.0–36.0)
MCV: 85.1 fL (ref 80.0–100.0)
Platelets: 719 10*3/uL — ABNORMAL HIGH (ref 150–400)
RBC: 3.22 MIL/uL — ABNORMAL LOW (ref 3.87–5.11)
RDW: 15.7 % — ABNORMAL HIGH (ref 11.5–15.5)
WBC: 18.5 10*3/uL — ABNORMAL HIGH (ref 4.0–10.5)
nRBC: 0 % (ref 0.0–0.2)

## 2021-10-28 LAB — BASIC METABOLIC PANEL
Anion gap: 10 (ref 5–15)
BUN: 20 mg/dL (ref 8–23)
CO2: 21 mmol/L — ABNORMAL LOW (ref 22–32)
Calcium: 8.6 mg/dL — ABNORMAL LOW (ref 8.9–10.3)
Chloride: 97 mmol/L — ABNORMAL LOW (ref 98–111)
Creatinine, Ser: 0.84 mg/dL (ref 0.44–1.00)
GFR, Estimated: >60 mL/min (ref >60)
Glucose, Bld: 99 mg/dL (ref 70–99)
Potassium: 4.8 mmol/L (ref 3.5–5.1)
Sodium: 128 mmol/L — ABNORMAL LOW (ref 135–145)

## 2021-10-28 MED ORDER — ACETAMINOPHEN 325 MG PO TABS
650.0000 mg | ORAL_TABLET | Freq: Once | ORAL | Status: AC
  Administered 2021-10-28: 650 mg via ORAL
  Filled 2021-10-28: qty 2

## 2021-10-28 NOTE — ED Triage Notes (Signed)
Pt presents via EMS c/o failure to thrive per EMS. EMS reports pt refusing to eat and take medication today. Pt alert and oriented to self and situation. Disoriented to year. Pt reports right foot pain when palpated. Pt is hospice pt per EMS. Full code per EMS as well.

## 2021-10-28 NOTE — ED Notes (Signed)
Lab notified of phlebotomist needed for blood redraw

## 2021-10-29 ENCOUNTER — Emergency Department
Admission: EM | Admit: 2021-10-29 | Discharge: 2021-10-29 | Disposition: A | Discharge status: Home / Self Care | Attending: Emergency Medicine | Admitting: Emergency Medicine

## 2021-10-29 ENCOUNTER — Emergency Department

## 2021-10-29 DIAGNOSIS — G893 Neoplasm related pain (acute) (chronic): Secondary | ICD-10-CM

## 2021-10-29 LAB — SARS CORONAVIRUS 2 BY RT PCR: SARS Coronavirus 2 by RT PCR: NEGATIVE

## 2021-10-29 LAB — PROCALCITONIN: Procalcitonin: 0.31 ng/mL

## 2021-10-29 MED ORDER — FENTANYL CITRATE PF 50 MCG/ML IJ SOSY
50.0000 ug | PREFILLED_SYRINGE | Freq: Once | INTRAMUSCULAR | Status: AC
  Administered 2021-10-29: 50 ug via INTRAVENOUS
  Filled 2021-10-29: qty 1

## 2021-10-29 MED ORDER — SODIUM CHLORIDE 0.9 % IV BOLUS (SEPSIS)
500.0000 mL | Freq: Once | INTRAVENOUS | Status: AC
  Administered 2021-10-29: 500 mL via INTRAVENOUS

## 2021-10-29 MED ORDER — ONDANSETRON HCL 4 MG/2ML IJ SOLN
4.0000 mg | Freq: Once | INTRAMUSCULAR | Status: AC
  Administered 2021-10-29: 4 mg via INTRAVENOUS
  Filled 2021-10-29: qty 2

## 2021-10-29 NOTE — Discharge Instructions (Signed)
I recommend further discussion with hospice for help with end-of-life care for your mother.  They can help provide you with further information and medications that can help make her more comfortable at home if needed.

## 2021-10-29 NOTE — ED Provider Notes (Signed)
[facility] Provider Note    Event Date/Time   First MD Initiated Contact with Patient 10/29/21 0154     (approximate)   History   Failure To Thrive   HPI  Brenda Bender is a 81 y.o. female with history of hypertension, diabetes, hyperlipidemia, CHF, endometrial cancer status post hysterectomy in December 2021 with recent diagnosis of recurrent metastatic disease in July 2023 on hospice.  Daughter at the bedside reports that the patient has been complaining of increasing abdominal pain, back pain and they state they could not get her to eat, drink or take her medications tonight.  They were concerned she was dehydrated and called EMS.  They deny any known fevers, cough, difficulty breathing, vomiting, diarrhea.  Daughter at the bedside and said that the patient is a full code but that she is on hospice.  Daughter at bedside is not sure of her goals of care.   History provided by patient's daughter.    Past Medical History:  Diagnosis Date   Arthritis    CHF (congestive heart failure) (HCC)    Diabetes mellitus without complication (HCC)    HLD (hyperlipidemia)    Hypertension     Past Surgical History:  Procedure Laterality Date   VENTRAL HERNIA REPAIR N/A 04/30/2019   Procedure: HERNIA REPAIR VENTRAL ADULT;  Surgeon: Jules Husbands, MD;  Location: [facility] ORS;  Service: General;  Laterality: N/A;    MEDICATIONS:  Prior to Admission medications   Medication Sig Start Date End Date Taking? Authorizing Provider  acetaminophen (TYLENOL) 325 MG tablet Take 2 tablets (650 mg total) by mouth every 6 (six) hours as needed for mild pain, headache or fever (or Fever >/= 101). 09/03/21   Nicole Kindred A, DO  amLODipine (NORVASC) 5 MG tablet Take 1 tablet (5 mg total) by mouth daily. 09/04/21   Ezekiel Slocumb, DO  apixaban (ELIQUIS) 5 MG TABS tablet Take 2 tablets (10 mg total) by mouth 2 (two) times daily for 7 days, THEN 1 tablet (5 mg total) 2 (two) times  daily. 09/03/21 10/10/21  Ezekiel Slocumb, DO  aspirin EC 81 MG tablet Take 81 mg by mouth daily.    [provider]  atorvastatin (LIPITOR) 40 MG tablet Take 40 mg by mouth daily.    [provider]  cetirizine (ZYRTEC) 10 MG tablet Take 10 mg by mouth daily. 07/24/21   [provider]  fluticasone (FLONASE) 50 MCG/ACT nasal spray Place 1 spray into both nostrils daily.    [provider]  HYDROcodone-acetaminophen (NORCO) 10-325 MG tablet Take 1 tablet by mouth every 4 (four) hours as needed for moderate pain or severe pain. 10/24/21   Borders, Kirt Boys, NP  JANUVIA 25 MG tablet Take 25 mg by mouth daily. 07/24/21   [provider]  losartan (COZAAR) 100 MG tablet Take 100 mg by mouth daily. 07/24/21   [provider]  megestrol (MEGACE) 40 MG tablet Take 2 tablets (80 mg total) by mouth daily. 09/18/21   Mellody Drown, MD  metoprolol tartrate (LOPRESSOR) 50 MG tablet Take 50 mg by mouth 2 (two) times daily.    [provider]  morphine (MS CONTIN) 15 MG 12 hr tablet Take 1 tablet (15 mg total) by mouth every 12 (twelve) hours. 10/16/21   Borders, Kirt Boys, NP  ondansetron (ZOFRAN) 4 MG tablet Take 1 tablet (4 mg total) by mouth every 6 (six) hours as needed for nausea. 09/03/21  Nicole Kindred A, DO  polyethylene glycol (MIRALAX / GLYCOLAX) 17 g packet Take 17 g by mouth daily as needed for mild constipation. 09/03/21   Ezekiel Slocumb, DO  senna (SENOKOT) 8.6 MG TABS tablet Take 1 tablet (8.6 mg total) by mouth daily as needed for mild constipation. 10/16/21   Borders, Kirt Boys, NP  vitamin B-12 (CYANOCOBALAMIN) 1000 MCG tablet Take 1,000 mcg by mouth once a week. 07/26/21   [provider]    Physical Exam   Triage Vital Signs: ED Triage Vitals  Enc Vitals Group     BP 10/28/21 2052 139/86     Pulse Rate 10/28/21 2052 92     Resp 10/28/21 2052 14     Temp 10/28/21 2052 99.3 F (37.4 C)     Temp Source 10/28/21 2052 Oral      SpO2 10/28/21 2052 97 %     Weight --      Height --      Head Circumference --      Peak Flow --      Pain Score 10/28/21 2058 0     Pain Loc --      Pain Edu? --      Excl. in [place]? --     Most recent vital signs: Vitals:   10/29/21 0050 10/29/21 0214  BP: 122/64 (!) 114/50  Pulse: 86 93  Resp: 18 (!) 30  Temp: 98.7 F (37.1 C)   SpO2: 96% 98%    CONSTITUTIONAL: Alert but unable to answer questions appropriately.  Thin, elderly. HEAD: Normocephalic, atraumatic EYES: Conjunctivae clear, pupils appear equal, sclera nonicteric ENT: normal nose; moist mucous membranes NECK: Supple, normal ROM CARD: RRR; S1 and S2 appreciated; no murmurs, no clicks, no rubs, no gallops RESP: Normal chest excursion without splinting or tachypnea; breath sounds clear and equal bilaterally; no wheezes, no rhonchi, no rales, no hypoxia or respiratory distress, speaking full sentences ABD/GI: Normal bowel sounds; non-distended; soft, fusilli tender throughout the abdomen without guarding or rebound BACK: The back appears normal EXT: Normal ROM in all joints; no deformity noted, no edema; no cyanosis SKIN: Normal color for age and race; warm; no rash on exposed skin NEURO: Moves all extremities equally, normal speech PSYCH: The patient's mood and manner are appropriate.   ED Results / Procedures / Treatments   LABS: (all labs ordered are listed, but only abnormal results are displayed) Labs Reviewed  BASIC METABOLIC PANEL - Abnormal; Notable for the following components:      Result Value   Sodium 128 (*)    Chloride 97 (*)    CO2 21 (*)    Calcium 8.6 (*)    All other components within normal limits  CBC - Abnormal; Notable for the following components:   WBC 18.5 (*)    RBC 3.22 (*)    Hemoglobin 8.3 (*)    HCT 27.4 (*)    MCH 25.8 (*)    RDW 15.7 (*)    Platelets 719 (*)    All other components within normal limits  SARS CORONAVIRUS 2 BY RT PCR  PROCALCITONIN     EKG:  EKG  Interpretation  Date/Time:  Monday October 28 2021 21:44:55 EDT Ventricular Rate:  100 PR Interval:  136 QRS Duration: 60 QT Interval:  322 QTC Calculation: 415 R Axis:   40 Text Interpretation: Sinus rhythm with Premature atrial complexes Cannot rule out Anterior infarct , age undetermined Abnormal ECG When compared with ECG of 19-Jun-1994  15:25, Premature atrial complexes are now Present Questionable change in QRS duration Minimal criteria for Anterior infarct are now Present Confirmed by Lilya Smitherman, Cyril Mourning [phone]) on 10/29/2021 2:07:54 AM         RADIOLOGY: My personal review and interpretation of imaging:    I have personally reviewed all radiology reports.   No results found.   PROCEDURES:  Critical Care performed: No     .1-3 Lead EKG Interpretation  Performed by: Milford Cilento, Delice Bison, DO Authorized by: Flor Houdeshell, Delice Bison, DO     Interpretation: normal     ECG rate:  86   ECG rate assessment: normal     Rhythm: sinus rhythm     Ectopy: none     Conduction: normal       IMPRESSION / MDM / ASSESSMENT AND PLAN / ED COURSE  I reviewed the triage vital signs and the nursing notes.    Patient here with concerns for dehydration as family states that she would not eat, drink or take her medications tonight.  The patient is on the cardiac monitor to evaluate for evidence of arrhythmia and/or significant heart rate changes.   DIFFERENTIAL DIAGNOSIS (includes but not limited to):   Dehydration, progression of her metastatic cancer, electrolyte derangement, anemia, bowel obstruction, perforation, colitis, diverticulitis, appendicitis, UTI   Patient's presentation is most consistent with acute presentation with potential threat to life or bodily function.   PLAN: CBC, BMP obtained from triage.  Patient has a new leukocytosis of 18,000.  Chronic anemia with hemoglobin of 8.3.  Sodium of 128 but otherwise normal electrolytes.  Family member at the bedside is not able to tell me  patient's goals of care.  She states she is on hospice but also states that the patient is a full code.  We discussed different options of work-up here in the emergency department, treatment versus hydrating her and giving her pain medication to make her comfortable and sending her home.  Daughter at bedside is uncomfortable making these decisions and has tried to get in touch with other family members multiple times by phone without success.  She request that I get in touch with the [facility] hospice nurse for further information on patient's goals of care.   MEDICATIONS GIVEN IN ED: Medications  sodium chloride 0.9 % bolus 500 mL (has no administration in time range)  fentaNYL (SUBLIMAZE) injection 50 mcg (has no administration in time range)  ondansetron (ZOFRAN) injection 4 mg (has no administration in time range)  acetaminophen (TYLENOL) tablet 650 mg (650 mg Oral Given 10/28/21 2120)     ED COURSE: Spoke with Mendel Ryder, hospice nurse on-call for [facility].  She states that the family called out tonight with their concerns and hospice was going to send a nurse but then family called 43 to come to the hospital before the nurse could get there.  She states because she is not on-call nurse she is not sure of patient's goals of care.  She is unable to provide any other information but did discuss with patient's daughter that they should talk to the hospice nurse in the morning about their goals.  I had another lengthy discussion with patient's daughter.  Given patient likely has underlying dementia and is unable to provide much history or answer many questions, this decision falls on her family.  This daughter at the bedside has been in contact with other family members tonight.  We discussed that her initial results show a leukocytosis of 18,000 and a procalcitonin  of 0.3 which could be suggestive of developing infection.  Have offered chest x-ray, urinalysis, blood cultures, CT of the abdomen pelvis,  IV fluids, antibiotics and admission.  We discussed though that given she has history of stage IV metastatic cancer and is on hospice and is not getting chemotherapy or radiation that the goal of hospice is normally to make people comfortable while they are actively dying.  Daughter agrees with this and states that she does not want the patient to have further work-up in the ED and would be comfortable with her just receiving IV fluids and pain medicine and then going back home.  They will talk to the hospice nurse in the morning and potentially reverse her full code to a DNR.  I talked to daughter if at any time they change their mind they are welcome to bring her back to the emergency department for further work-up and admission.   CONSULTS: Admission offered but family declines as patient is on hospice for metastatic cancer not receiving treatment.   OUTSIDE RECORDS REVIEWED: Reviewed patient's last oncology note on 10/16/2021.       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   Riah Kehoe, Delice Bison, DO 10/29/21 [phone]

## 2021-11-05 ENCOUNTER — Telehealth: Payer: Self-pay | Admitting: Hospice and Palliative Medicine

## 2021-11-05 NOTE — Telephone Encounter (Signed)
Call from hospice nurse, Lorriane Shire.  Patient is having vaginal pruritus with concern for possible yeast infection.  Okay to start fluconazole 150 mg every 72 hours x 2 doses.  Additionally, patient is having severe burning pain to feet. Start gabapentin 100 mg nightly and slow titration to 3 times daily.

## 2021-11-06 ENCOUNTER — Inpatient Hospital Stay
Admission: EM | Admit: 2021-11-06 | Discharge: 2021-11-10 | DRG: 812 | Disposition: A | Discharge status: Hospice | Attending: Internal Medicine | Admitting: Internal Medicine

## 2021-11-06 ENCOUNTER — Emergency Department

## 2021-11-06 ENCOUNTER — Encounter: Payer: Self-pay | Admitting: Emergency Medicine

## 2021-11-06 DIAGNOSIS — B3749 Other urogenital candidiasis: Secondary | ICD-10-CM | POA: Diagnosis present

## 2021-11-06 DIAGNOSIS — B379 Candidiasis, unspecified: Secondary | ICD-10-CM | POA: Diagnosis not present

## 2021-11-06 DIAGNOSIS — Z7984 Long term (current) use of oral hypoglycemic drugs: Secondary | ICD-10-CM

## 2021-11-06 DIAGNOSIS — C772 Secondary and unspecified malignant neoplasm of intra-abdominal lymph nodes: Secondary | ICD-10-CM | POA: Diagnosis present

## 2021-11-06 DIAGNOSIS — E785 Hyperlipidemia, unspecified: Secondary | ICD-10-CM | POA: Diagnosis present

## 2021-11-06 DIAGNOSIS — D75839 Thrombocytosis, unspecified: Secondary | ICD-10-CM | POA: Diagnosis present

## 2021-11-06 DIAGNOSIS — D649 Anemia, unspecified: Principal | ICD-10-CM

## 2021-11-06 DIAGNOSIS — E119 Type 2 diabetes mellitus without complications: Secondary | ICD-10-CM | POA: Diagnosis present

## 2021-11-06 DIAGNOSIS — Z515 Encounter for palliative care: Secondary | ICD-10-CM

## 2021-11-06 DIAGNOSIS — Z7982 Long term (current) use of aspirin: Secondary | ICD-10-CM

## 2021-11-06 DIAGNOSIS — Z79899 Other long term (current) drug therapy: Secondary | ICD-10-CM

## 2021-11-06 DIAGNOSIS — L8990 Pressure ulcer of unspecified site, unspecified stage: Secondary | ICD-10-CM | POA: Insufficient documentation

## 2021-11-06 DIAGNOSIS — Z7401 Bed confinement status: Secondary | ICD-10-CM

## 2021-11-06 DIAGNOSIS — C541 Malignant neoplasm of endometrium: Secondary | ICD-10-CM | POA: Diagnosis present

## 2021-11-06 DIAGNOSIS — Z85028 Personal history of other malignant neoplasm of stomach: Secondary | ICD-10-CM | POA: Diagnosis not present

## 2021-11-06 DIAGNOSIS — I509 Heart failure, unspecified: Secondary | ICD-10-CM | POA: Diagnosis present

## 2021-11-06 DIAGNOSIS — K5900 Constipation, unspecified: Secondary | ICD-10-CM | POA: Diagnosis present

## 2021-11-06 DIAGNOSIS — I700 Atherosclerosis of aorta: Secondary | ICD-10-CM | POA: Diagnosis present

## 2021-11-06 DIAGNOSIS — R627 Adult failure to thrive: Secondary | ICD-10-CM | POA: Diagnosis present

## 2021-11-06 DIAGNOSIS — Z90710 Acquired absence of both cervix and uterus: Secondary | ICD-10-CM

## 2021-11-06 DIAGNOSIS — I110 Hypertensive heart disease with heart failure: Secondary | ICD-10-CM | POA: Diagnosis present

## 2021-11-06 DIAGNOSIS — Z7901 Long term (current) use of anticoagulants: Secondary | ICD-10-CM

## 2021-11-06 DIAGNOSIS — Z833 Family history of diabetes mellitus: Secondary | ICD-10-CM | POA: Diagnosis not present

## 2021-11-06 DIAGNOSIS — Z66 Do not resuscitate: Secondary | ICD-10-CM | POA: Diagnosis present

## 2021-11-06 DIAGNOSIS — D62 Acute posthemorrhagic anemia: Secondary | ICD-10-CM | POA: Diagnosis present

## 2021-11-06 DIAGNOSIS — I10 Essential (primary) hypertension: Secondary | ICD-10-CM | POA: Diagnosis present

## 2021-11-06 DIAGNOSIS — Z20822 Contact with and (suspected) exposure to covid-19: Secondary | ICD-10-CM | POA: Diagnosis present

## 2021-11-06 DIAGNOSIS — K922 Gastrointestinal hemorrhage, unspecified: Secondary | ICD-10-CM

## 2021-11-06 LAB — URINALYSIS, ROUTINE W REFLEX MICROSCOPIC
Bilirubin Urine: NEGATIVE
Glucose, UA: NEGATIVE mg/dL
Hgb urine dipstick: NEGATIVE
Ketones, ur: NEGATIVE mg/dL
Nitrite: NEGATIVE
Protein, ur: NEGATIVE mg/dL
Specific Gravity, Urine: 1.018 (ref 1.005–1.030)
WBC, UA: >50 WBC/hpf — ABNORMAL HIGH (ref 0–5)
pH: 5 (ref 5.0–8.0)

## 2021-11-06 LAB — CBC WITH DIFFERENTIAL/PLATELET
Abs Immature Granulocytes: 0.12 10*3/uL — ABNORMAL HIGH (ref 0.00–0.07)
Basophils Absolute: 0 10*3/uL (ref 0.0–0.1)
Basophils Relative: 0 %
Eosinophils Absolute: 0 10*3/uL (ref 0.0–0.5)
Eosinophils Relative: 0 %
HCT: 21.2 % — ABNORMAL LOW (ref 36.0–46.0)
Hemoglobin: 6.6 g/dL — ABNORMAL LOW (ref 12.0–15.0)
Immature Granulocytes: 1 %
Lymphocytes Relative: 6 %
Lymphs Abs: 1.1 10*3/uL (ref 0.7–4.0)
MCH: 25.9 pg — ABNORMAL LOW (ref 26.0–34.0)
MCHC: 31.1 g/dL (ref 30.0–36.0)
MCV: 83.1 fL (ref 80.0–100.0)
Monocytes Absolute: 0.8 10*3/uL (ref 0.1–1.0)
Monocytes Relative: 5 %
Neutro Abs: 15 10*3/uL — ABNORMAL HIGH (ref 1.7–7.7)
Neutrophils Relative %: 88 %
Platelets: 560 10*3/uL — ABNORMAL HIGH (ref 150–400)
RBC: 2.55 MIL/uL — ABNORMAL LOW (ref 3.87–5.11)
RDW: 16.1 % — ABNORMAL HIGH (ref 11.5–15.5)
WBC: 17.1 10*3/uL — ABNORMAL HIGH (ref 4.0–10.5)
nRBC: 0 % (ref 0.0–0.2)

## 2021-11-06 LAB — COMPREHENSIVE METABOLIC PANEL
ALT: 6 U/L (ref 0–44)
AST: 17 U/L (ref 15–41)
Albumin: 2.3 g/dL — ABNORMAL LOW (ref 3.5–5.0)
Alkaline Phosphatase: 58 U/L (ref 38–126)
Anion gap: 6 (ref 5–15)
BUN: 21 mg/dL (ref 8–23)
CO2: 22 mmol/L (ref 22–32)
Calcium: 8.2 mg/dL — ABNORMAL LOW (ref 8.9–10.3)
Chloride: 107 mmol/L (ref 98–111)
Creatinine, Ser: 0.9 mg/dL (ref 0.44–1.00)
GFR, Estimated: >60 mL/min (ref >60)
Glucose, Bld: 115 mg/dL — ABNORMAL HIGH (ref 70–99)
Potassium: 3.8 mmol/L (ref 3.5–5.1)
Sodium: 135 mmol/L (ref 135–145)
Total Bilirubin: 1 mg/dL (ref 0.3–1.2)
Total Protein: 6.9 g/dL (ref 6.5–8.1)

## 2021-11-06 LAB — ABO/RH: ABO/RH(D): O POS

## 2021-11-06 LAB — PREPARE RBC (CROSSMATCH)

## 2021-11-06 LAB — LIPASE, BLOOD: Lipase: 22 U/L (ref 11–51)

## 2021-11-06 MED ORDER — PANTOPRAZOLE 80MG IVPB - SIMPLE MED
80.0000 mg | Freq: Once | INTRAVENOUS | Status: AC
  Administered 2021-11-06: 80 mg via INTRAVENOUS
  Filled 2021-11-06: qty 100

## 2021-11-06 MED ORDER — SODIUM CHLORIDE 0.9 % IV BOLUS
1000.0000 mL | Freq: Once | INTRAVENOUS | Status: AC
  Administered 2021-11-06: 1000 mL via INTRAVENOUS

## 2021-11-06 MED ORDER — SODIUM CHLORIDE 0.9 % IV SOLN
1.0000 g | Freq: Once | INTRAVENOUS | Status: AC
  Administered 2021-11-06: 1 g via INTRAVENOUS
  Filled 2021-11-06: qty 10

## 2021-11-06 MED ORDER — IOHEXOL 300 MG/ML  SOLN
100.0000 mL | Freq: Once | INTRAMUSCULAR | Status: DC | PRN
Start: 2021-11-06 — End: 2021-11-09

## 2021-11-06 MED ORDER — SODIUM CHLORIDE 0.9 % IV SOLN: 10.0000 mL/h | Freq: Once | INTRAVENOUS | Status: DC

## 2021-11-06 MED ORDER — PANTOPRAZOLE INFUSION (NEW) - SIMPLE MED
8.0000 mg/h | INTRAVENOUS | Status: DC
  Administered 2021-11-07 – 2021-11-09 (×6): 8 mg/h via INTRAVENOUS
  Filled 2021-11-06 (×7): qty 100

## 2021-11-06 MED ORDER — PANTOPRAZOLE SODIUM 40 MG IV SOLR: 40.0000 mg | Freq: Two times a day (BID) | INTRAVENOUS | Status: DC

## 2021-11-06 NOTE — ED Provider Notes (Signed)
[facility] Provider Note    Event Date/Time   First MD Initiated Contact with Patient 11/06/21 2023     (approximate)   History   Abdominal Pain   HPI  Brenda Bender is a 81 y.o. female who presents to the emergency department today because of concern for dehydration and abdominal pain.  The patient herself cannot give any significant history.  There was however family concern for possible dehydration and requiring fluids as well as increased abdominal pain.  Patient does have history of stomach cancer and is currently patient with hospital service.      Physical Exam   Triage Vital Signs: ED Triage Vitals  Enc Vitals Group     BP 11/06/21 2010 (!) 109/51     Pulse Rate 11/06/21 2010 97     Resp 11/06/21 2010 18     Temp 11/06/21 2010 98.7 F (37.1 C)     Temp Source 11/06/21 2010 Oral     SpO2 11/06/21 2010 100 %     Weight 11/06/21 2010 133 lb 13.1 oz (60.7 kg)     Height 11/06/21 2010 '5\' 2"'$  (1.575 m)     Head Circumference --      Peak Flow --      Pain Score 11/06/21 2011 6     Pain Loc --      Pain Edu? --      Excl. in [place]? --     Most recent vital signs: Vitals:   11/06/21 2010  BP: (!) 109/51  Pulse: 97  Resp: 18  Temp: 98.7 F (37.1 C)  SpO2: 100%    General: Awake, alert, not completely oriented. CV:  Good peripheral perfusion. Regular rate and rhythm. Resp:  Normal effort. Lungs clear. Abd:  No distention. Diffusely tender. Rectal:  GUIAC positive. Melenotic stool on glove.    ED Results / Procedures / Treatments   Labs (all labs ordered are listed, but only abnormal results are displayed) Labs Reviewed  CBC WITH DIFFERENTIAL/PLATELET - Abnormal; Notable for the following components:      Result Value   WBC 17.1 (*)    RBC 2.55 (*)    Hemoglobin 6.6 (*)    HCT 21.2 (*)    MCH 25.9 (*)    RDW 16.1 (*)    Platelets 560 (*)    Neutro Abs 15.0 (*)    Abs Immature Granulocytes 0.12 (*)    All other components  within normal limits  COMPREHENSIVE METABOLIC PANEL - Abnormal; Notable for the following components:   Glucose, Bld 115 (*)    Calcium 8.2 (*)    Albumin 2.3 (*)    All other components within normal limits  URINALYSIS, ROUTINE W REFLEX MICROSCOPIC - Abnormal; Notable for the following components:   Color, Urine YELLOW (*)    APPearance CLOUDY (*)    Leukocytes,Ua LARGE (*)    WBC, UA >50 (*)    Bacteria, UA RARE (*)    Non Squamous Epithelial PRESENT (*)    All other components within normal limits  URINE CULTURE  LIPASE, BLOOD  TYPE AND SCREEN  PREPARE RBC (CROSSMATCH)  ABO/RH     EKG  None   RADIOLOGY I independently interpreted and visualized the CT abd/pel. My interpretation: No free air. Radiology interpretation:  IMPRESSION:  1. Interval progression of retroperitoneal and pelvic masses and  adenopathy since the prior CT.  2. Constipation. No bowel obstruction.  3. Erosive changes of the  S1 as seen on the prior CT.  4. Aortic Atherosclerosis (ICD10-I70.0).     PROCEDURES:  Critical Care performed: Yes  Procedures  CRITICAL CARE Performed by: Nance Pear   Total critical care time: 30 minutes  Critical care time was exclusive of separately billable procedures and treating other patients.  Critical care was necessary to treat or prevent imminent or life-threatening deterioration.  Critical care was time spent personally by me on the following activities: development of treatment plan with patient and/or surrogate as well as nursing, discussions with consultants, evaluation of patient's response to treatment, examination of patient, obtaining history from patient or surrogate, ordering and performing treatments and interventions, ordering and review of laboratory studies, ordering and review of radiographic studies, pulse oximetry and re-evaluation of patient's condition.   MEDICATIONS ORDERED IN ED: Medications  sodium chloride 0.9 % bolus 1,000 mL  (has no administration in time range)     IMPRESSION / MDM / ASSESSMENT AND PLAN / ED COURSE  I reviewed the triage vital signs and the nursing notes.                              Differential diagnosis includes, but is not limited to, dehydration, anemia, infection.  Patient's presentation is most consistent with acute presentation with potential threat to life or bodily function.  Patient presented to the emergency department today because of concerns for possible dehydration as well as increased abdominal pain.  In terms of concerns for dehydration no significant hypotension or significant tachycardia.  Blood work without elevated creatinine or hyponatremia.  At this time I have lower concern for dehydration.  However patient was anemic today.  She does have some baseline anemia but it is worse today at 6.6.  Certainly could cause weakness.  Rectal exam was positive for melanotic stool on the glove and was guaiac positive.  I do have concerns from bleeding from her cancer.  Discussed with family at bedside.  Will plan on starting transfusions as well as IV Protonix.  In terms of the abdominal pain a CT scan was obtained which showed worsening cancer burden.  Patient's urine is concerning for possible infection.  Will start antibiotics.  Discussed with Dr. Francine Graven with the hospital service will plan on admission.   FINAL CLINICAL IMPRESSION(S) / ED DIAGNOSES   Final diagnoses:  None     Rx / DC Orders   ED Discharge Orders     None        Note:  This document was prepared using Dragon voice recognition software and may include unintentional dictation errors.    Nance Pear, MD 11/06/21 [phone]

## 2021-11-06 NOTE — ED Triage Notes (Addendum)
Pt presents via EMS from home with complaints of abdominal pain. Hx of stomach CA and is on hospice care. Pt has been taking Hydrocodone at home as prescribed and was seen by hospice today. Per EMS, they stated family called to have her seen due to her increased pain after being seen by hospice. Pt received 12.33mg of Fentanyl with EMS PTA which improved her sx. Denies CP or SOB.    WMariann Laster RN ([facility] contacted this RN and stated that the family called EMS due to the patient being dehydrated and needing fluids. Any concerns or questions for hospice be directed to [phone].

## 2021-11-06 NOTE — ED Notes (Signed)
Pt to CT

## 2021-11-07 DIAGNOSIS — D62 Acute posthemorrhagic anemia: Secondary | ICD-10-CM | POA: Diagnosis not present

## 2021-11-07 DIAGNOSIS — Z515 Encounter for palliative care: Secondary | ICD-10-CM

## 2021-11-07 LAB — CBC
HCT: 26.7 % — ABNORMAL LOW (ref 36.0–46.0)
Hemoglobin: 8.3 g/dL — ABNORMAL LOW (ref 12.0–15.0)
MCH: 26.8 pg (ref 26.0–34.0)
MCHC: 31.1 g/dL (ref 30.0–36.0)
MCV: 86.1 fL (ref 80.0–100.0)
Platelets: 524 10*3/uL — ABNORMAL HIGH (ref 150–400)
RBC: 3.1 MIL/uL — ABNORMAL LOW (ref 3.87–5.11)
RDW: 16.2 % — ABNORMAL HIGH (ref 11.5–15.5)
WBC: 20.2 10*3/uL — ABNORMAL HIGH (ref 4.0–10.5)
nRBC: 0 % (ref 0.0–0.2)

## 2021-11-07 LAB — BPAM RBC
Blood Product Expiration Date: 202310112359
ISSUE DATE / TIME: 202309062332
Unit Type and Rh: 5100

## 2021-11-07 LAB — BASIC METABOLIC PANEL
Anion gap: 5 (ref 5–15)
BUN: 16 mg/dL (ref 8–23)
CO2: 22 mmol/L (ref 22–32)
Calcium: 7.9 mg/dL — ABNORMAL LOW (ref 8.9–10.3)
Chloride: 109 mmol/L (ref 98–111)
Creatinine, Ser: 0.76 mg/dL (ref 0.44–1.00)
GFR, Estimated: >60 mL/min (ref >60)
Glucose, Bld: 89 mg/dL (ref 70–99)
Potassium: 3.9 mmol/L (ref 3.5–5.1)
Sodium: 136 mmol/L (ref 135–145)

## 2021-11-07 LAB — TYPE AND SCREEN
ABO/RH(D): O POS
Antibody Screen: NEGATIVE
Unit division: 0

## 2021-11-07 LAB — PROCALCITONIN: Procalcitonin: 2.37 ng/mL

## 2021-11-07 LAB — GLUCOSE, CAPILLARY: Glucose-Capillary: 83 mg/dL (ref 70–99)

## 2021-11-07 LAB — CBG MONITORING, ED: Glucose-Capillary: 89 mg/dL (ref 70–99)

## 2021-11-07 MED ORDER — APIXABAN 5 MG PO TABS: 5.0000 mg | ORAL_TABLET | Freq: Two times a day (BID) | ORAL | Status: DC

## 2021-11-07 MED ORDER — ONDANSETRON HCL 4 MG/2ML IJ SOLN: 4.0000 mg | Freq: Four times a day (QID) | INTRAMUSCULAR | Status: DC | PRN

## 2021-11-07 MED ORDER — ATORVASTATIN CALCIUM 20 MG PO TABS: 40.0000 mg | ORAL_TABLET | Freq: Every day | ORAL | Status: DC

## 2021-11-07 MED ORDER — SODIUM CHLORIDE 0.9 % IV SOLN: INTRAVENOUS | Status: DC

## 2021-11-07 MED ORDER — SENNA 8.6 MG PO TABS
1.0000 | ORAL_TABLET | Freq: Every day | ORAL | Status: DC | PRN
Start: 2021-11-07 — End: 2021-11-09

## 2021-11-07 MED ORDER — OXYCODONE-ACETAMINOPHEN 7.5-325 MG PO TABS
1.0000 | ORAL_TABLET | ORAL | Status: DC | PRN
  Administered 2021-11-07: 1 via ORAL
  Filled 2021-11-07: qty 1

## 2021-11-07 MED ORDER — POLYETHYLENE GLYCOL 3350 17 G PO PACK: 17.0000 g | PACK | Freq: Every day | ORAL | Status: DC | PRN

## 2021-11-07 MED ORDER — ONDANSETRON HCL 4 MG PO TABS: 4.0000 mg | ORAL_TABLET | Freq: Four times a day (QID) | ORAL | Status: DC | PRN

## 2021-11-07 MED ORDER — MORPHINE SULFATE (PF) 2 MG/ML IV SOLN
2.0000 mg | INTRAVENOUS | Status: DC | PRN
  Administered 2021-11-07 (×2): 2 mg via INTRAVENOUS
  Filled 2021-11-07 (×2): qty 1

## 2021-11-07 MED ORDER — LORAZEPAM 2 MG/ML IJ SOLN
1.0000 mg | INTRAMUSCULAR | Status: DC | PRN
  Administered 2021-11-07 – 2021-11-09 (×3): 1 mg via INTRAVENOUS
  Filled 2021-11-07 (×3): qty 1

## 2021-11-07 MED ORDER — MORPHINE SULFATE (PF) 2 MG/ML IV SOLN
2.0000 mg | INTRAVENOUS | Status: DC | PRN
  Administered 2021-11-07: 2 mg via INTRAVENOUS
  Filled 2021-11-07: qty 1

## 2021-11-07 MED ORDER — ACETAMINOPHEN 325 MG PO TABS: 650.0000 mg | ORAL_TABLET | Freq: Four times a day (QID) | ORAL | Status: DC | PRN

## 2021-11-07 MED ORDER — BISACODYL 10 MG RE SUPP
10.0000 mg | Freq: Once | RECTAL | Status: AC
Start: 2021-11-07 — End: 2021-11-07
  Administered 2021-11-07: 10 mg via RECTAL
  Filled 2021-11-07: qty 1

## 2021-11-07 MED ORDER — METOPROLOL TARTRATE 50 MG PO TABS
50.0000 mg | ORAL_TABLET | Freq: Two times a day (BID) | ORAL | Status: DC
  Administered 2021-11-07: 50 mg via ORAL
  Filled 2021-11-07: qty 1

## 2021-11-07 MED ORDER — METOPROLOL TARTRATE 5 MG/5ML IV SOLN: 5.0000 mg | Freq: Three times a day (TID) | INTRAVENOUS | Status: DC | PRN

## 2021-11-07 MED ORDER — HYDROMORPHONE HCL 1 MG/ML IJ SOLN
1.0000 mg | INTRAMUSCULAR | Status: DC | PRN
  Administered 2021-11-07 – 2021-11-09 (×18): 1 mg via INTRAVENOUS
  Filled 2021-11-07 (×18): qty 1

## 2021-11-07 NOTE — Progress Notes (Signed)
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 Hold aspirin, eliquis. S/p 1 unit of pRBCs transfused. H&H are trending up today. CT abd/pelvis shows interval progression of retroperitoneal & pelvic masses & adenopathy since prior CT, constipation & erosive changes of S1 (seen on prior CT). Continue on PPI    Adenocarcinoma of endometrium: w/ mets. No treatment options are available as per onco. On hospice care at home. Dilaudid, percocet prn. Family is considering comfort care only & pt would benefit from inpatient hospice. Palliative care consulted    HTN: continue on metoprolol   DM2: HbA1c 5.9, well controlled. Diet controlled       DVT prophylaxis: SCDs Code Status: full  Family Communication:  Disposition Plan: depends on what family decides   Level of care: Progressive  Status is: Inpatient Remains inpatient appropriate because: severity of illness    Consultants:  Palliative care  Procedures:   Antimicrobials:   Subjective: Pt c/o back and abd pain   Objective: Vitals:   11/07/21 0430 11/07/21 0530 11/07/21 0534 11/07/21 0630  BP: 126/67 120/69  127/79  Pulse: (!) 102 100 95 (!) 106  Resp: '16 11 16 17  '$ Temp:  98.3 F (36.8 C)    TempSrc:      SpO2: 100% 99% 100% 100%  Weight:      Height:        Intake/Output Summary (Last 24 hours) at 11/07/2021 6295 Last data filed at 11/07/2021 0125 Gross per 24 hour  Intake 396.33 ml  Output 600 ml  Net -203.67 ml   Filed Weights   11/06/21 2010  Weight: 60.7 kg    Examination:  General exam: Appears restless &  uncomfortable  Respiratory system: diminished breath sounds b/l  Cardiovascular system: S1 & S2+. No rubs, gallops or clicks.  Gastrointestinal system: Abdomen is nondistended, soft and tenderness to palpation. Hypoactive bowel sounds heard. Central nervous system: Alert and awake Psychiatry: Judgement and insight appear normal. Flat mood and affect    Data Reviewed: I have personally reviewed following labs and imaging studies  CBC: Recent Labs  Lab 11/06/21 2019 11/07/21 0410  WBC 17.1* 20.2*  NEUTROABS 15.0*  --   HGB 6.6* 8.3*  HCT 21.2* 26.7*  MCV 83.1 86.1  PLT 560* 284*   Basic Metabolic Panel: Recent Labs  Lab 11/06/21 2019 11/07/21 0410  NA 135 136  K 3.8 3.9  CL 107 109  CO2 22 22  GLUCOSE 115* 89  BUN 21 16  CREATININE 0.90 0.76  CALCIUM 8.2* 7.9*   GFR: Estimated Creatinine Clearance: 48.1 mL/min (by C-G formula based on SCr of 0.76 mg/dL). Liver Function Tests: Recent Labs  Lab 11/06/21 2019  AST 17  ALT 6  ALKPHOS 58  BILITOT 1.0  PROT 6.9  ALBUMIN 2.3*   Recent Labs  Lab 11/06/21 2019  LIPASE 22   No results for input(s): "AMMONIA" in the last 168 hours. Coagulation Profile: No results for input(s): "INR", "PROTIME" in the last 168 hours. Cardiac Enzymes: No results for input(s): "CKTOTAL", "CKMB", "CKMBINDEX", "TROPONINI" in the last 168 hours. BNP (last 3 results) No  results for input(s): "PROBNP" in the last 8760 hours. HbA1C: No results for input(s): "HGBA1C" in the last 72 hours. CBG: Recent Labs  Lab 11/07/21 0409  GLUCAP 89   Lipid Profile: No results for input(s): "CHOL", "HDL", "LDLCALC", "TRIG", "CHOLHDL", "LDLDIRECT" in the last 72 hours. Thyroid Function Tests: No results for input(s): "TSH", "T4TOTAL", "FREET4", "T3FREE", "THYROIDAB" in the last 72 hours. Anemia Panel: No results for input(s): "VITAMINB12", "FOLATE", "FERRITIN", "TIBC", "IRON", "RETICCTPCT" in the last 72 hours. Sepsis Labs: No results for  input(s): "PROCALCITON", "LATICACIDVEN" in the last 168 hours.  Recent Results (from the past 240 hour(s))  SARS Coronavirus 2 by RT PCR (hospital order, performed in [facility] hospital lab) *cepheid single result test* Anterior Nasal Swab     Status: None   Collection Time: 10/29/21 12:47 AM   Specimen: Anterior Nasal Swab  Result Value Ref Range Status   SARS Coronavirus 2 by RT PCR NEGATIVE NEGATIVE Final    Comment: (NOTE) SARS-CoV-2 target nucleic acids are NOT DETECTED.  The SARS-CoV-2 RNA is generally detectable in upper and lower respiratory specimens during the acute phase of infection. The lowest concentration of SARS-CoV-2 viral copies this assay can detect is 250 copies / mL. A negative result does not preclude SARS-CoV-2 infection and should not be used as the sole basis for treatment or other patient management decisions.  A negative result may occur with improper specimen collection / handling, submission of specimen other than nasopharyngeal swab, presence of viral mutation(s) within the areas targeted by this assay, and inadequate number of viral copies (<250 copies / mL). A negative result must be combined with clinical observations, patient history, and epidemiological information.  Fact Sheet for Patients:   https://www.patel.info/  Fact Sheet for Healthcare Providers: https://hall.com/  This test is not yet approved or  cleared by the [place] FDA and has been authorized for detection and/or diagnosis of SARS-CoV-2 by FDA under an Emergency Use Authorization (EUA).  This EUA will remain in effect (meaning this test can be used) for the duration of the COVID-19 declaration under Section 564(b)(1) of the Act, 21 U.S.C. section 360bbb-3(b)(1), unless the authorization is terminated or revoked sooner.  Performed at [facility], [address], [place], [place] [address]          Radiology  Studies: CT ABDOMEN PELVIS WO CONTRAST  Result Date: 11/06/2021 CLINICAL DATA:  Abdominal pain. EXAM: CT ABDOMEN AND PELVIS WITHOUT CONTRAST TECHNIQUE: Multidetector CT imaging of the abdomen and pelvis was performed following the standard protocol without IV contrast. RADIATION DOSE REDUCTION: This exam was performed according to the departmental dose-optimization program which includes automated exposure control, adjustment of the mA and/or kV according to patient size and/or use of iterative reconstruction technique. COMPARISON:  CT abdomen pelvis dated 08/31/2021. FINDINGS: Evaluation of this exam is limited in the absence of intravenous contrast as well as due to respiratory motion. Lower chest: Left lung base subpleural atelectasis/scarring. The visualized lung bases are otherwise clear. There is coronary vascular calcification. There is hypoattenuation of the cardiac blood pool suggestive of anemia. Clinical correlation is recommended. No intra-abdominal free air or free fluid. Hepatobiliary: The liver is unremarkable. No biliary ductal dilatation. The gallbladder is distended. No calcified gallstone. Pancreas: No active inflammatory changes. No dilatation of the main pancreatic duct. Spleen: Normal in size without focal abnormality. Adrenals/Urinary Tract: The adrenal glands are unremarkable. There is no hydronephrosis or nephrolithiasis on either side. The urinary bladder is partially distended. There is apparent  diffuse thickening of the bladder wall which may be partly related to underdistention. Cystitis is not excluded. Correlation with urinalysis recommended. Stomach/Bowel: There is large amount of stool throughout the colon. No bowel obstruction or active inflammation. Postsurgical changes of bowel with anastomotic sutures. Vascular/Lymphatic: Mild aortoiliac atherosclerotic disease. Large retroperitoneal masses/adenopathy significantly progressed since the prior CT. A 5.5 x 7.0 cm mass to the right  of aorta (35/2) previously measured 3.8 x 3.8 cm. An 8.8 x 8.5 cm mass abutting the left iliacus muscle, previously measured 7.3 x 7.5 cm. A 5.5 x 2.5 cm mass posterior to the right iliac arteries previously measured 3.6 x 2.5 cm. Reproductive: Hysterectomy. Other: Diffuse subcutaneous edema. Musculoskeletal: Erosive changes of the S1 as seen on the prior CT. No acute osseous pathology. IMPRESSION: 1. Interval progression of retroperitoneal and pelvic masses and adenopathy since the prior CT. 2. Constipation. No bowel obstruction. 3. Erosive changes of the S1 as seen on the prior CT. 4. Aortic Atherosclerosis (ICD10-I70.0). Electronically Signed   By: Anner Crete M.D.   On: 11/06/2021 22:53        Scheduled Meds:  atorvastatin  40 mg Oral Daily   metoprolol tartrate  50 mg Oral BID   [START ON 11/10/2021] pantoprazole  40 mg Intravenous Q12H   Continuous Infusions:  sodium chloride Stopped (11/06/21 2259)   sodium chloride 100 mL/hr at 11/07/21 0048   pantoprazole 8 mg/hr (11/07/21 0007)     LOS: 1 day    Time spent: 35 mins     Wyvonnia Dusky, MD [facility] Pager [phone]  If 7PM-7AM, please contact night-coverage [email or web address] 11/07/2021, 8:22 AM

## 2021-11-07 NOTE — ED Notes (Signed)
Ok'd by Dr. Francine Graven to obtain repeat CBC post infusion with AM labs.

## 2021-11-07 NOTE — H&P (Signed)
History and Physical    Patient: Brenda Bender CZY:[id number] DOB: [birth year] DOA: 11/06/2021 DOS: the patient was seen and examined on 11/07/2021 PCP: [facility] [employer]  Patient coming from: Home  Chief Complaint:  Chief Complaint  Patient presents with   Abdominal Pain    Most of the history was obtained from patient's daughter at the bedside. HPI: Brenda Bender is a 81 y.o. female with medical history significant for hypertension, diabetes mellitus, endometrial cancer status post hysterectomy in 12/21 with recent diagnosis of recurrent metastatic disease in July, 2023 currently at home with hospice care.  Patient was brought into the ER by EMS for evaluation of worsening abdominal pain associated with poor oral intake and refusal of her medications.  Family was concerned she may be dehydrated and so brought her to the ER for evaluation. She received fentanyl 12.5 mcg by EMS. I am unable to do review of systems on this patient due to her underlying condition Labs reveal a drop in her H&H from 8.3 >> 6.6 and leukocytosis with a white count of 17,000. Family states that they have not seen any active bleeding such as vaginal bleed and states they have not seen any melena stools or hematemesis. Daughter wants her mother active issues to be addressed and wants her to get blood transfusion. Per ER provider stool was heme positive Patient was started on Protonix drip.   Review of Systems: unable to review all systems due to the inability of the patient to answer questions. Past Medical History:  Diagnosis Date   Arthritis    CHF (congestive heart failure) (HCC)    Diabetes mellitus without complication (HCC)    HLD (hyperlipidemia)    Hypertension    Past Surgical History:  Procedure Laterality Date   VENTRAL HERNIA REPAIR N/A 04/30/2019   Procedure: HERNIA REPAIR VENTRAL ADULT;  Surgeon: Jules Husbands, MD;  Location: [facility] ORS;  Service: General;  Laterality: N/A;    Social History:  reports that she has never smoked. She has never used smokeless tobacco. She reports that she does not drink alcohol and does not use drugs.  Allergies  Allergen Reactions   Penicillins Nausea And Vomiting    Family History  Problem Relation Age of Onset   Diabetes Mother    Endometrial cancer Neg Hx     Prior to Admission medications   Medication Sig Start Date End Date Taking? Authorizing Provider  acetaminophen (TYLENOL) 325 MG tablet Take 2 tablets (650 mg total) by mouth every 6 (six) hours as needed for mild pain, headache or fever (or Fever >/= 101). 09/03/21   Nicole Kindred A, DO  amLODipine (NORVASC) 5 MG tablet Take 1 tablet (5 mg total) by mouth daily. 09/04/21   Ezekiel Slocumb, DO  apixaban (ELIQUIS) 5 MG TABS tablet Take 2 tablets (10 mg total) by mouth 2 (two) times daily for 7 days, THEN 1 tablet (5 mg total) 2 (two) times daily. 09/03/21 10/10/21  Ezekiel Slocumb, DO  aspirin EC 81 MG tablet Take 81 mg by mouth daily.    [provider]  atorvastatin (LIPITOR) 40 MG tablet Take 40 mg by mouth daily.    [provider]  cetirizine (ZYRTEC) 10 MG tablet Take 10 mg by mouth daily. 07/24/21   [provider]  fluticasone (FLONASE) 50 MCG/ACT nasal spray Place 1 spray into both nostrils daily.    [provider]  HYDROcodone-acetaminophen (NORCO) 10-325 MG tablet Take 1 tablet by  mouth every 4 (four) hours as needed for moderate pain or severe pain. 10/24/21   Borders, Kirt Boys, NP  JANUVIA 25 MG tablet Take 25 mg by mouth daily. 07/24/21   [provider]  losartan (COZAAR) 100 MG tablet Take 100 mg by mouth daily. 07/24/21   [provider]  megestrol (MEGACE) 40 MG tablet Take 2 tablets (80 mg total) by mouth daily. 09/18/21   Mellody Drown, MD  metoprolol tartrate (LOPRESSOR) 50 MG tablet Take 50 mg by mouth 2 (two) times daily.    [provider]  morphine (MS CONTIN) 15 MG 12 hr tablet Take 1  tablet (15 mg total) by mouth every 12 (twelve) hours. 10/16/21   Borders, Kirt Boys, NP  ondansetron (ZOFRAN) 4 MG tablet Take 1 tablet (4 mg total) by mouth every 6 (six) hours as needed for nausea. 09/03/21   Ezekiel Slocumb, DO  polyethylene glycol (MIRALAX / GLYCOLAX) 17 g packet Take 17 g by mouth daily as needed for mild constipation. 09/03/21   Ezekiel Slocumb, DO  senna (SENOKOT) 8.6 MG TABS tablet Take 1 tablet (8.6 mg total) by mouth daily as needed for mild constipation. 10/16/21   Borders, Kirt Boys, NP  vitamin B-12 (CYANOCOBALAMIN) 1000 MCG tablet Take 1,000 mcg by mouth once a week. 07/26/21   [provider]    Physical Exam: Vitals:   11/06/21 2200 11/06/21 2250 11/06/21 2340 11/06/21 2355  BP: (!) 109/54 (!) 113/58 136/89 136/89  Pulse: 85 99 97 97  Resp: 12 17 20 20   Temp:   98.5 F (36.9 C) 98.5 F (36.9 C)  TempSrc:   Oral Oral  SpO2: 99% 100%  100%  Weight:      Height:        Physical Exam Vitals and nursing note reviewed.  Constitutional:      Comments: Appears to be uncomfortable and in pain.  HENT:     Head: Normocephalic and atraumatic.     Mouth/Throat:     Comments: Dry mucous membranes Eyes:     Comments: Pale conjunctiva  Cardiovascular:     Rate and Rhythm: Normal rate and regular rhythm.  Pulmonary:     Effort: Pulmonary effort is normal.     Breath sounds: Normal breath sounds.  Abdominal:     General: Abdomen is flat.     Palpations: Abdomen is rigid.     Tenderness: There is generalized abdominal tenderness.  Skin:    General: Skin is warm and dry.  Neurological:     Mental Status: She is alert.     Motor: Weakness present.  Psychiatric:        Mood and Affect: Mood is anxious.     Data Reviewed: Relevant notes from primary care and specialist visits, past discharge summaries as available in EHR, including Care Everywhere. Prior diagnostic testing as pertinent to current admission diagnoses Updated medications and problem  lists for reconciliation ED course, including vitals, labs, imaging, treatment and response to treatment Triage notes, nursing and pharmacy notes and ED provider's notes Notable results as noted in HPI Labs reviewed.  Lipase 22, sodium 135, potassium 3.8, chloride 107, bicarb 22, glucose 115, BUN 21, creatinine 0.90, calcium 8.2, total protein 6.9, albumin 2.3, AST 17, ALT 6, alk phos 58, total bilirubin 1.0, white count 17.1, hemoglobin 6.6, hematocrit 21.2, RDW 16.1, platelet count 560 CT scan of abdomen and pelvis shows interval progression of retroperitoneal and pelvic masses and adenopathy since the  prior CT.Constipation. No bowel obstruction. Erosive changes of the S1 as seen on the prior CT. Aortic Atherosclerosis There are no new results to review at this time.  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      Advance Care Planning:   Code Status: Full Code   Consults: Palliative care  Family Communication: Greater than 50% of time was spent discussing patient's condition and plan of care with her daughter at the bedside.  All questions and concerns have been addressed.  She verbalizes  understanding and agrees with the plan.  CODE STATUS was discussed and she is a full code  Severity of Illness: The appropriate patient status for this patient is INPATIENT. Inpatient status is judged to be reasonable and necessary in order to provide the required intensity of service to ensure the patient's safety. The patient's presenting symptoms, physical exam findings, and initial radiographic and laboratory data in the context of their chronic comorbidities is felt to place them at high risk for further clinical deterioration. Furthermore, it is not anticipated that the patient will be medically stable for discharge from the hospital within 2 midnights of admission.   * I certify that at the point of admission it is my clinical judgment that the patient will require inpatient hospital care spanning beyond 2 midnights from the point of admission due to high intensity of service, high risk for further deterioration and high frequency of surveillance required.*  Author: Collier Bullock, MD 11/07/2021 12:27 AM  For on call review [email or web address].

## 2021-11-07 NOTE — Assessment & Plan Note (Signed)
Patient noted to have metastatic adenocarcinoma of the endometrium and is currently on hospice care at home Palliative care consult for goals of care conversation

## 2021-11-07 NOTE — Assessment & Plan Note (Signed)
Patient with a history of metastatic endometrial carcinoma On hospice care at home

## 2021-11-07 NOTE — Consult Note (Signed)
[place] at [facility] [phone]) [phone] [phone]   Name: Brenda Bender Date: 11/07/2021 MRN: [id number]  DOB: 15-Jan-1941  Patient Care Team: [facility], [place] as PCP - Lidia Collum, Dorathy Daft, RN as Oncology Nurse Navigator    REASON FOR CONSULTATION: Brenda Bender is a 81 y.o. female with multiple medical problems including diabetes, hypertension, hyperlipidemia, and endometrial cancer status post hysterectomy December 2021.  She was subsequently lost to follow-up.  Patient was hospitalized 08/31/2021 to 09/03/2021 with abdominal pain, weight loss.  CT of the abdomen and pelvis revealed a new retroperitoneal masses and gonadal vein thrombosis concerning for recurrent metastatic disease.  Patient underwent CT-guided biopsy confirming recurrent endometrial cancer.  She subsequently had outpatient follow-up with both medical oncology and GYN oncology.  Patient was reluctant to proceed with any chemotherapy or radiation but did agree to try Megace and repeat scans in 3 months.  Ultimately, patient declined and opted to proceed with home hospice.  She was admitted to the hospital on 11/07/2021 with abdominal pain.  She is found to have acute blood loss anemia probably from GI origin.  Palliative care was consulted to address goals.  SOCIAL HISTORY:     reports that she has never smoked. She has never used smokeless tobacco. She reports that she does not drink alcohol and does not use drugs.  Patient lives at home with her daughter.  She has 3 daughters and 4 sons surviving of 65 children.  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  She did not follow-up after surgery.  - 08/31/21 presented to ER for abdominal pain for several months. CT showed retroperitoneal mass, adenopathy. 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 - 09/02/21 CT guided biopsy of abdominopelvic soft tissue  Positive for metastatic carcinoma, compatible with known endometrioid adenocarcinoma.   -   08/31/2021 Imaging   CT abdomen pelvis with contrast showed new retroperitoneal mass involving the left iliopsoas muscle and right presacral space, causing osteolysis of the right anterior upper sacrum.  New abdominal region paratonia adenopathy.  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  Stable bilateral hydronephrosis.   09/06/2021 Cancer Staging   Staging form: Corpus Uteri - Carcinoma and Carcinosarcoma, AJCC 8th Edition - Clinical stage from 09/06/2021: FIGO Stage IVB (cTX, cN2a, cM1) - Signed by Earlie Server, MD on 09/07/2021 Stage prefix: Initial diagnosis     ALLERGIES:  is allergic to penicillins.  MEDICATIONS:  Current Facility-Administered Medications  Medication Dose Route Frequency Provider Last Rate Last Admin   0.9 %  sodium chloride infusion  10 mL/hr Intravenous Once Nance Pear, MD  Held at 11/06/21 2259   acetaminophen (TYLENOL) tablet 650 mg  650 mg Oral Q6H PRN Agbata, Tochukwu, MD       atorvastatin (LIPITOR) tablet 40 mg  40 mg Oral Daily Agbata, Tochukwu, MD       HYDROmorphone (DILAUDID)  injection 1 mg  1 mg Intravenous Q2H PRN Wyvonnia Dusky, MD   1 mg at 11/07/21 1711   iohexol (OMNIPAQUE) 300 MG/ML solution 100 mL  100 mL Intravenous Once PRN Nance Pear, MD       metoprolol tartrate (LOPRESSOR) tablet 50 mg  50 mg Oral BID Agbata, Tochukwu, MD       ondansetron (ZOFRAN) tablet 4 mg  4 mg Oral Q6H PRN Agbata, Tochukwu, MD       Or   ondansetron (ZOFRAN) injection 4 mg  4 mg Intravenous Q6H PRN Agbata, Tochukwu, MD       oxyCODONE-acetaminophen (PERCOCET) 7.5-325 MG per tablet 1 tablet  1 tablet Oral Q4H PRN Wyvonnia Dusky, MD       [START ON 11/10/2021] pantoprazole (PROTONIX) injection 40 mg  40 mg Intravenous Q12H Nance Pear, MD       pantoprozole (PROTONIX) 80 mg /NS 100 mL infusion  8 mg/hr Intravenous Continuous Nance Pear, MD 10 mL/hr at 11/07/21 0830 8 mg/hr at 11/07/21 0830   polyethylene glycol (MIRALAX / GLYCOLAX) packet 17 g  17 g Oral Daily PRN Agbata, Tochukwu, MD       senna (SENOKOT) tablet 8.6 mg  1 tablet Oral Daily PRN Agbata, Tochukwu, MD        VITAL SIGNS: BP 136/74 (BP Location: Left Arm)   Pulse (!) 131   Temp 98.1 F (36.7 C)   Resp 19   Ht '5\' 2"'$  (1.575 m)   Wt 133 lb 13.1 oz (60.7 kg)   SpO2 100%   BMI 24.48 kg/m  Filed Weights   11/06/21 2010  Weight: 133 lb 13.1 oz (60.7 kg)    Estimated body mass index is 24.48 kg/m as calculated from the following:   Height as of this encounter: '5\' 2"'$  (1.575 m).   Weight as of this encounter: 133 lb 13.1 oz (60.7 kg).  LABS: CBC:    Component Value Date/Time   WBC 20.2 (H) 11/07/2021 0410   HGB 8.3 (L) 11/07/2021 0410   HCT 26.7 (L) 11/07/2021 0410   PLT 524 (H) 11/07/2021 0410   MCV 86.1 11/07/2021 0410   NEUTROABS 15.0 (H) 11/06/2021 2019   LYMPHSABS 1.1 11/06/2021 2019   MONOABS 0.8 11/06/2021 2019   EOSABS 0.0 11/06/2021 2019   BASOSABS 0.0 11/06/2021 2019   Comprehensive Metabolic Panel:    Component Value Date/Time   NA 136 11/07/2021 0410   K 3.9  11/07/2021 0410   CL 109 11/07/2021 0410   CO2 22 11/07/2021 0410   BUN 16 11/07/2021 0410   CREATININE 0.76 11/07/2021 0410   GLUCOSE 89 11/07/2021 0410   CALCIUM 7.9 (L) 11/07/2021 0410   AST 17 11/06/2021 2019   ALT 6 11/06/2021 2019   ALKPHOS 58 11/06/2021 2019   BILITOT 1.0 11/06/2021 2019   PROT 6.9 11/06/2021 2019   ALBUMIN 2.3 (L) 11/06/2021 2019    RADIOGRAPHIC STUDIES: CT ABDOMEN PELVIS WO CONTRAST  Result Date: 11/06/2021 CLINICAL DATA:  Abdominal pain. EXAM: CT ABDOMEN AND PELVIS WITHOUT CONTRAST TECHNIQUE: Multidetector CT imaging of the abdomen and pelvis was performed following the standard protocol without IV contrast. RADIATION DOSE REDUCTION: This exam was performed according  to the departmental dose-optimization program which includes automated exposure control, adjustment of the mA and/or kV according to patient size and/or use of iterative reconstruction technique. COMPARISON:  CT abdomen pelvis dated 08/31/2021. FINDINGS: Evaluation of this exam is limited in the absence of intravenous contrast as well as due to respiratory motion. Lower chest: Left lung base subpleural atelectasis/scarring. The visualized lung bases are otherwise clear. There is coronary vascular calcification. There is hypoattenuation of the cardiac blood pool suggestive of anemia. Clinical correlation is recommended. No intra-abdominal free air or free fluid. Hepatobiliary: The liver is unremarkable. No biliary ductal dilatation. The gallbladder is distended. No calcified gallstone. Pancreas: No active inflammatory changes. No dilatation of the main pancreatic duct. Spleen: Normal in size without focal abnormality. Adrenals/Urinary Tract: The adrenal glands are unremarkable. There is no hydronephrosis or nephrolithiasis on either side. The urinary bladder is partially distended. There is apparent diffuse thickening of the bladder wall which may be partly related to underdistention. Cystitis is not excluded.  Correlation with urinalysis recommended. Stomach/Bowel: There is large amount of stool throughout the colon. No bowel obstruction or active inflammation. Postsurgical changes of bowel with anastomotic sutures. Vascular/Lymphatic: Mild aortoiliac atherosclerotic disease. Large retroperitoneal masses/adenopathy significantly progressed since the prior CT. A 5.5 x 7.0 cm mass to the right of aorta (35/2) previously measured 3.8 x 3.8 cm. An 8.8 x 8.5 cm mass abutting the left iliacus muscle, previously measured 7.3 x 7.5 cm. A 5.5 x 2.5 cm mass posterior to the right iliac arteries previously measured 3.6 x 2.5 cm. Reproductive: Hysterectomy. Other: Diffuse subcutaneous edema. Musculoskeletal: Erosive changes of the S1 as seen on the prior CT. No acute osseous pathology. IMPRESSION: 1. Interval progression of retroperitoneal and pelvic masses and adenopathy since the prior CT. 2. Constipation. No bowel obstruction. 3. Erosive changes of the S1 as seen on the prior CT. 4. Aortic Atherosclerosis (ICD10-I70.0). Electronically Signed   By: Anner Crete M.D.   On: 11/06/2021 22:53    PERFORMANCE STATUS (ECOG) : 4 - Bedbound  Review of Systems Unable to complete  Physical Exam General: Ill-appearing Pulmonary: clear ant fields Extremities: no edema, no joint deformities Skin: no rashes Neurological: Weakness but otherwise nonfocal  IMPRESSION: Patient seen in the ED.  She was uncomfortable appearing and verbalized pain despite recent dose of IV morphine.  Hospitalist rotated to hydromorphone.  I called and had separate conversations by phone with all three daughters.  I also spoke with patient's hospice nurse, Lorriane Shire.  Family reports the patient has been declining.  She is essentially bedbound at this point.  I explained to family that patient is likely nearing end-of-life in the setting of normal cancer.  I would recommend continued hospice involvement and focusing on patient's comfort.  She is  clearly uncomfortable today.  There is clearly some disagreement among the children in regards to goals.  Patient's daughter, [name], stated that her preference would be to just focus solely on comfort and transfer patient to inpatient hospice facility.  Patient's other daughters, Joseph Art and Ivin Booty, would be interested in transferring patient home with hospice.  There is also some disagreement in regards to CODE STATUS but daughters agreed to speak with each other and make a decision.  I strongly recommended DNR/DNI.  Discussed with hospice liaison who will help coordinate final disposition.  PLAN: -Recommend continued hospice involvement either at home or hospice facility -Recommend continued aggressive pain control and comfort measures -Hospice liaison to coordinate    Time Total: 70 minutes  Visit consisted of counseling and education dealing with the complex and emotionally intense issues of symptom management and palliative care in the setting of serious and potentially life-threatening illness.Greater than 50%  of this time was spent counseling and coordinating care related to the above assessment and plan.  Signed by: Altha Harm, PhD, NP-C

## 2021-11-07 NOTE — Assessment & Plan Note (Signed)
Patient has poor oral intake related to her underlying condition Hold Januvia Check blood sugars every 4 hours while n.p.o.

## 2021-11-07 NOTE — ED Notes (Signed)
Dr. Francine Graven @ the bedside with family.

## 2021-11-07 NOTE — ED Notes (Signed)
Brief changed, new purewick applied.

## 2021-11-07 NOTE — Assessment & Plan Note (Signed)
Continue metoprolol for blood pressure

## 2021-11-07 NOTE — Progress Notes (Signed)
[facility] ED Rm 3 [facility] ([facility]) hospitalized hospice patient visit   Ms. Brenda Bender is a current [facility] patient with a terminal diagnosis of malignant neoplasm of the endometrium. She was seen by [facility] nurse earlier in the day due to issues with pain. Later in the day patient again began experiencing worsening pain and family made decision to activate EMS and patient was brought to ED. She was admitted 9.6.23 with diagnosis of Anemia. Per Dr. Feldmann with [facility] this is a related hospital admission.   Met at bedside with Brenda Bender who was crying out intermittently but did quiet when someone held her hand. Exchanged report with bedside nurse and had long conversation with daughter Brenda Bender who came to bedside. Patient had blood transfusion during the night and was treated for some mild dehydration.   Brenda Bender is inpatient appropriate due to blood transfusion and hospital level monitoring   Vital Signs-  98.4/118/26   127/75   100% room air Intake/Output- Not yet recorded Abnormal labs-  Ca+ 7.9, Albumin 2.3, WBC 20.2, Hgb 8.3 (post transfusion), Hct 26.7 Diagnostics-  CT ABDOMEN AND PELVIS WITHOUT CONTRAST   Vascular/Lymphatic: Mild aortoiliac atherosclerotic disease. Large retroperitoneal masses/adenopathy significantly progressed since the prior CT. A 5.5 x 7.0 cm mass to the right of aorta (35/2) previously measured 3.8 x 3.8 cm.   An 8.8 x 8.5 cm mass abutting the left iliacus muscle, previously measured 7.3 x 7.5 cm. A 5.5 x 2.5 cm mass posterior to the right iliac arteries previously measured 3.6 x 2.5 cm.   IMPRESSION: 1. Interval progression of retroperitoneal and pelvic masses and adenopathy since the prior CT. 2. Constipation. No bowel obstruction. 3. Erosive changes of the S1 as seen on the prior CT. 4. Aortic Atherosclerosis (ICD10-I70.0).  Electronically Signed   By: Arash  Radparvar M.D.   On: 11/06/2021 22:53  IV/PRN Meds-  NS @ 100cc/H, Rocephin 1 gram IV once, Protonix  80mg IV x1 and then 8mg/H, Dilaudid 1mg q2H prn x2, MSO4 2mg q2H x3   Problem List Acute blood loss anemia: etiology unclear. Hold aspirin, eliquis. S/p 1 unit of pRBCs transfused. H&H are trending up today. CT abd/pelvis shows interval progression of retroperitoneal & pelvic masses & adenopathy since prior CT, constipation & erosive changes of S1 (seen on prior CT). Continue on PPI    Adenocarcinoma of endometrium: w/ mets. No treatment options are available as per onco. On hospice care at home. Dilaudid, percocet prn. Family is considering comfort care only & pt would benefit from inpatient hospice. Palliative care consulted    HTN: continue on metoprolol    DM2: HbA1c 5.9, well controlled. Diet controlled   Discharge Planning- Ongoing, family weighing options of home or IPU Family Contact- talked with daughter Brenda Bender at bedside IDT- Updated Goals of care - Unclear/Ongoing, patient remains a full code but continues to have progression of disease process. PMT assistance appreciated.  Hospital transfer and medication list placed on patient's hard chart.   Misty Green, RN, BSN, WTA-C Hospice Hospital Liaison [phone] 

## 2021-11-07 NOTE — ED Notes (Signed)
Repositioned with pillow by arm for comfort.

## 2021-11-07 NOTE — Assessment & Plan Note (Signed)
Patient presents to the ER for evaluation of abdominal pain and is noted to have a drop in her H&H from 8.3 >> 6.6 Discontinue aspirin and apixaban Transfuse 1 unit of packed RBC Continue Protonix drip initiated in the ER

## 2021-11-08 DIAGNOSIS — D62 Acute posthemorrhagic anemia: Principal | ICD-10-CM

## 2021-11-08 DIAGNOSIS — C541 Malignant neoplasm of endometrium: Secondary | ICD-10-CM

## 2021-11-08 DIAGNOSIS — E119 Type 2 diabetes mellitus without complications: Secondary | ICD-10-CM

## 2021-11-08 DIAGNOSIS — L8990 Pressure ulcer of unspecified site, unspecified stage: Secondary | ICD-10-CM | POA: Insufficient documentation

## 2021-11-08 LAB — HEPATIC FUNCTION PANEL
ALT: 7 U/L (ref 0–44)
AST: 15 U/L (ref 15–41)
Albumin: 1.8 g/dL — ABNORMAL LOW (ref 3.5–5.0)
Alkaline Phosphatase: 57 U/L (ref 38–126)
Bilirubin, Direct: 0.2 mg/dL (ref 0.0–0.2)
Indirect Bilirubin: 0.5 mg/dL (ref 0.3–0.9)
Total Bilirubin: 0.7 mg/dL (ref 0.3–1.2)
Total Protein: 6.2 g/dL — ABNORMAL LOW (ref 6.5–8.1)

## 2021-11-08 LAB — URINE CULTURE: Culture: 60000 — AB

## 2021-11-08 LAB — BASIC METABOLIC PANEL
Anion gap: 4 — ABNORMAL LOW (ref 5–15)
BUN: 14 mg/dL (ref 8–23)
CO2: 21 mmol/L — ABNORMAL LOW (ref 22–32)
Calcium: 8.3 mg/dL — ABNORMAL LOW (ref 8.9–10.3)
Chloride: 114 mmol/L — ABNORMAL HIGH (ref 98–111)
Creatinine, Ser: 0.62 mg/dL (ref 0.44–1.00)
GFR, Estimated: >60 mL/min (ref >60)
Glucose, Bld: 95 mg/dL (ref 70–99)
Potassium: 3.6 mmol/L (ref 3.5–5.1)
Sodium: 139 mmol/L (ref 135–145)

## 2021-11-08 LAB — GLUCOSE, CAPILLARY
Glucose-Capillary: 105 mg/dL — ABNORMAL HIGH (ref 70–99)
Glucose-Capillary: 86 mg/dL (ref 70–99)
Glucose-Capillary: 90 mg/dL (ref 70–99)
Glucose-Capillary: 93 mg/dL (ref 70–99)

## 2021-11-08 LAB — CBC
HCT: 25.4 % — ABNORMAL LOW (ref 36.0–46.0)
Hemoglobin: 7.9 g/dL — ABNORMAL LOW (ref 12.0–15.0)
MCH: 26.3 pg (ref 26.0–34.0)
MCHC: 31.1 g/dL (ref 30.0–36.0)
MCV: 84.7 fL (ref 80.0–100.0)
Platelets: 526 10*3/uL — ABNORMAL HIGH (ref 150–400)
RBC: 3 MIL/uL — ABNORMAL LOW (ref 3.87–5.11)
RDW: 16.7 % — ABNORMAL HIGH (ref 11.5–15.5)
WBC: 20.9 10*3/uL — ABNORMAL HIGH (ref 4.0–10.5)
nRBC: 0 % (ref 0.0–0.2)

## 2021-11-08 LAB — HEMOGLOBIN AND HEMATOCRIT, BLOOD
HCT: 28.4 % — ABNORMAL LOW (ref 36.0–46.0)
Hemoglobin: 8.8 g/dL — ABNORMAL LOW (ref 12.0–15.0)

## 2021-11-08 MED ORDER — MIDODRINE HCL 5 MG PO TABS: 5.0000 mg | ORAL_TABLET | Freq: Three times a day (TID) | ORAL | Status: DC

## 2021-11-08 MED ORDER — LACTATED RINGERS IV SOLN: INTRAVENOUS | Status: DC

## 2021-11-08 NOTE — Progress Notes (Addendum)
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 Extremely poor prognosis and high risk for further decline. Palliative care & hospice following and recs apprec   Acute blood loss anemia: etiology unclear. Holding eliquis,aspirin. S/p 1 unit of pRBCs transfused. H&H are labile. CT abd/pelvis shows interval progression of retroperitoneal & pelvic masses & adenopathy since prior CT, constipation & erosive changes of S1 (seen on prior CT). Continue on PPI    Adenocarcinoma of endometrium: w/ mets. No treatment options are available as per onco. On hospice care at home. Dilaudid, percocet prn. Family is considering comfort care only and pt would benefit from inpatient hospice. Palliative care & hospice are following. Extremely poor prognosis and high risk for further decline    HTN: holding home dose of metoprolol secondary low BP.   DM2: well controlled, HbA1c 5.9. Diet controlled       DVT prophylaxis: SCDs Code Status: DNR Family Communication: discussed pt's care w/ pt's son at bedside and answered his questions  Disposition Plan: depends on what family decides   Level of care: Progressive  Status is: Inpatient Remains inpatient appropriate because: severity of illness    Consultants:  Palliative care hospice  Procedures:   Antimicrobials:   Subjective: Pt did not verbalize any complaints   Objective: Vitals:   11/07/21 2036 11/07/21 2240 11/08/21 0005 11/08/21 0403  BP: 106/64  113/71 (!) 105/51  Pulse: (!) 109 (!) 120 90 90   Resp: '16  18 16  '$ Temp:   98.2 F (36.8 C) 98.9 F (37.2 C)  TempSrc:   Oral Oral  SpO2: 98%  98% 96%  Weight:      Height:        Intake/Output Summary (Last 24 hours) at 11/08/2021 0802 Last data filed at 11/08/2021 7124 Gross per 24 hour  Intake 1115.52 ml  Output --  Net 1115.52 ml   Filed Weights   11/06/21 2010  Weight: 60.7 kg    Examination:  General exam: Appears calm & comfortable   Respiratory system: decreased breath sounds b/l  Cardiovascular system: S1/S2+. No rubs or clicks Gastrointestinal system: Abd is soft, tenderness to palpation, hypoactive bowel sounds Central nervous system: Awake. Moves all extremities  Psychiatry: Judgement and insight appear normal. Flat mood and affect    Data Reviewed: I have personally reviewed following labs and imaging studies  CBC: Recent Labs  Lab 11/06/21 2019 11/07/21 0410 11/08/21 0454  WBC 17.1* 20.2* 20.9*  NEUTROABS 15.0*  --   --   HGB 6.6* 8.3* 7.9*  HCT 21.2* 26.7* 25.4*  MCV 83.1 86.1 84.7  PLT 560* 524* 580*   Basic Metabolic Panel: Recent Labs  Lab 11/06/21 2019 11/07/21 0410 11/08/21 0454  NA 135 136 139  K 3.8 3.9 3.6  CL 107 109 114*  CO2 22 22 21*  GLUCOSE 115* 89 95  BUN '21 16 14  '$ CREATININE 0.90 0.76 0.62  CALCIUM 8.2* 7.9* 8.3*   GFR: Estimated Creatinine Clearance: 48.1 mL/min (by C-G formula based on SCr of 0.62 mg/dL). Liver  Function Tests: Recent Labs  Lab 11/06/21 2019 11/08/21 0454  AST 17 15  ALT 6 7  ALKPHOS 58 57  BILITOT 1.0 0.7  PROT 6.9 6.2*  ALBUMIN 2.3* 1.8*   Recent Labs  Lab 11/06/21 2019  LIPASE 22   No results for input(s): "AMMONIA" in the last 168 hours. Coagulation Profile: No results for input(s): "INR", "PROTIME" in the last 168 hours. Cardiac Enzymes: No results for input(s): "CKTOTAL", "CKMB", "CKMBINDEX", "TROPONINI" in the last 168 hours. BNP (last 3 results) No results for input(s): "PROBNP" in the last 8760 hours. HbA1C: No results  for input(s): "HGBA1C" in the last 72 hours. CBG: Recent Labs  Lab 11/07/21 0409 11/07/21 2044 11/08/21 0040 11/08/21 0525  GLUCAP 89 83 90 93   Lipid Profile: No results for input(s): "CHOL", "HDL", "LDLCALC", "TRIG", "CHOLHDL", "LDLDIRECT" in the last 72 hours. Thyroid Function Tests: No results for input(s): "TSH", "T4TOTAL", "FREET4", "T3FREE", "THYROIDAB" in the last 72 hours. Anemia Panel: No results for input(s): "VITAMINB12", "FOLATE", "FERRITIN", "TIBC", "IRON", "RETICCTPCT" in the last 72 hours. Sepsis Labs: Recent Labs  Lab 11/07/21 0409  PROCALCITON 2.37    No results found for this or any previous visit (from the past 240 hour(s)).        Radiology Studies: CT ABDOMEN PELVIS WO CONTRAST  Result Date: 11/06/2021 CLINICAL DATA:  Abdominal pain. EXAM: CT ABDOMEN AND PELVIS WITHOUT CONTRAST TECHNIQUE: Multidetector CT imaging of the abdomen and pelvis was performed following the standard protocol without IV contrast. RADIATION DOSE REDUCTION: This exam was performed according to the departmental dose-optimization program which includes automated exposure control, adjustment of the mA and/or kV according to patient size and/or use of iterative reconstruction technique. COMPARISON:  CT abdomen pelvis dated 08/31/2021. FINDINGS: Evaluation of this exam is limited in the absence of intravenous contrast as well as due to respiratory motion. Lower chest: Left lung base subpleural atelectasis/scarring. The visualized lung bases are otherwise clear. There is coronary vascular calcification. There is hypoattenuation of the cardiac blood pool suggestive of anemia. Clinical correlation is recommended. No intra-abdominal free air or free fluid. Hepatobiliary: The liver is unremarkable. No biliary ductal dilatation. The gallbladder is distended. No calcified gallstone. Pancreas: No active inflammatory changes. No dilatation of the main pancreatic duct. Spleen: Normal in size without focal  abnormality. Adrenals/Urinary Tract: The adrenal glands are unremarkable. There is no hydronephrosis or nephrolithiasis on either side. The urinary bladder is partially distended. There is apparent diffuse thickening of the bladder wall which may be partly related to underdistention. Cystitis is not excluded. Correlation with urinalysis recommended. Stomach/Bowel: There is large amount of stool throughout the colon. No bowel obstruction or active inflammation. Postsurgical changes of bowel with anastomotic sutures. Vascular/Lymphatic: Mild aortoiliac atherosclerotic disease. Large retroperitoneal masses/adenopathy significantly progressed since the prior CT. A 5.5 x 7.0 cm mass to the right of aorta (35/2) previously measured 3.8 x 3.8 cm. An 8.8 x 8.5 cm mass abutting the left iliacus muscle, previously measured 7.3 x 7.5 cm. A 5.5 x 2.5 cm mass posterior to the right iliac arteries previously measured 3.6 x 2.5 cm. Reproductive: Hysterectomy. Other: Diffuse subcutaneous edema. Musculoskeletal: Erosive changes of the S1 as seen on the prior CT. No acute osseous pathology. IMPRESSION: 1. Interval progression of retroperitoneal and pelvic masses and adenopathy since the prior CT. 2. Constipation. No bowel obstruction. 3. Erosive changes of the S1 as seen on the prior CT. 4. Aortic Atherosclerosis (ICD10-I70.0). Electronically Signed   By: Laren Everts.D.  On: 11/06/2021 22:53        Scheduled Meds:  atorvastatin  40 mg Oral Daily   metoprolol tartrate  50 mg Oral BID   [START ON 11/10/2021] pantoprazole  40 mg Intravenous Q12H   Continuous Infusions:  sodium chloride Stopped (11/06/21 2259)   lactated ringers 100 mL/hr at 11/08/21 0625   pantoprazole 8 mg/hr (11/08/21 0600)     LOS: 2 days    Time spent: 36 mins     Wyvonnia Dusky, MD [facility] Pager [phone]  If 7PM-7AM, please contact night-coverage [email or web address] 11/08/2021, 8:02 AM

## 2021-11-08 NOTE — Progress Notes (Signed)
[employer] - nurse reports no urine output this shift and only 170 ml on bladder scan. BP soft and heart rates 90-105  Action - IV fluids LR 100 cc/h started.  Kathlene Cote NP [facility]

## 2021-11-08 NOTE — Progress Notes (Deleted)
Xopenex administered after being diluted with 3 ML of NS. Patient tolerated well. Home CPAP placed on patient using home settings. Patient verbalizes comfort and displays no s/s of distress or respiratory issues. 3L bled through to CPAP. Will continue to monitor.

## 2021-11-08 NOTE — Care Management Important Message (Signed)
Important Message  Patient Details  Name: Brenda Bender MRN: [id number] Date of Birth: 08-Nov-1940   Medicare Important Message Given:  N/A - LOS <3 / Initial given by admissions     Dannette Barbara 11/08/2021, 2:09 PM

## 2021-11-08 NOTE — Progress Notes (Signed)
[facility] Room [address] 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 She was evaluated by [facility] nurse on 9.6.23 due to ongoing issues with pain. Later in the day patient again began experiencing worsening pain and family made decision to activate EMS and patient was brought to ED. She was admitted 9.6.23 with diagnosis of Anemia. Per Dr. Karie Georges with [facility] this is a related hospital admission.   Visited patient at the bedside. Family present. Brenda Bender does not respond to our voices during the visit. She appears comfortable, with current needs being met by hospital interventions. Discussed with family, at their request, the trajectory of her current illness. The family shares that they would like to see if there is any remote improvement over the next day, and if not, they want to take her home and make her as comfortable as possible at home. They are not interested in residential hospice at this time.   Mr. Paulus (her eldest son) has been staying at her house consistently over the last few weeks to care for her. Her family is prepared to continue this for the remainder of her life.   Discussion surrounding her code status, currently a full code, family was in agreement that she would not want any heroic measures should her heart stop. Will update the hospital team with their request.  V/S: 98.3 oral, 114/57, HR 94, RR 20, SPO2 97% on RA I&O: 1115 in/ output not recorded Labs: albumin 1.8, WBCs 20.9, RBC 3.0, hgb 7.9, HCT 25.4 Diagnostics: none today IV/PRN: protonix infusion @ 8 mg/hr continuous, LR continuous @ 75 ml/hr, dialudid 1 mg IV x 5 since 8 pm 9.8.23, ativan 1 mg IV x 1  Ms. Lanagan is inpatient appropriate for treatment of pain and GI bleed.   Problem List: - acute blood loss anemia - etiology unclear, now NPO, ASA and eliquis held, protonix infusion -  adenocarcinoma of the endometrium - under hospice services for this, no further treatment - pain - now appears to be managed with hospital interventions  D/C planning: family wants to take her home after another 24 hours to see if any improvement Family: present at the bedside IDT: hospice team update GOC: family agreeable to DNR, updated hospital team  Thank you, Venia Carbon DNP, RN [place]  ([place])

## 2021-11-09 DIAGNOSIS — C541 Malignant neoplasm of endometrium: Secondary | ICD-10-CM | POA: Diagnosis not present

## 2021-11-09 DIAGNOSIS — D62 Acute posthemorrhagic anemia: Secondary | ICD-10-CM | POA: Diagnosis not present

## 2021-11-09 DIAGNOSIS — B379 Candidiasis, unspecified: Secondary | ICD-10-CM | POA: Diagnosis not present

## 2021-11-09 LAB — GLUCOSE, CAPILLARY
Glucose-Capillary: 101 mg/dL — ABNORMAL HIGH (ref 70–99)
Glucose-Capillary: 96 mg/dL (ref 70–99)
Glucose-Capillary: 107 mg/dL — ABNORMAL HIGH (ref 70–99)
Glucose-Capillary: 129 mg/dL — ABNORMAL HIGH (ref 70–99)
Glucose-Capillary: 104 mg/dL — ABNORMAL HIGH (ref 70–99)
Glucose-Capillary: 98 mg/dL (ref 70–99)

## 2021-11-09 LAB — BASIC METABOLIC PANEL
Anion gap: 8 (ref 5–15)
BUN: 13 mg/dL (ref 8–23)
CO2: 21 mmol/L — ABNORMAL LOW (ref 22–32)
Calcium: 8.6 mg/dL — ABNORMAL LOW (ref 8.9–10.3)
Chloride: 112 mmol/L — ABNORMAL HIGH (ref 98–111)
Creatinine, Ser: 0.61 mg/dL (ref 0.44–1.00)
GFR, Estimated: >60 mL/min (ref >60)
Glucose, Bld: 113 mg/dL — ABNORMAL HIGH (ref 70–99)
Potassium: 3.7 mmol/L (ref 3.5–5.1)
Sodium: 141 mmol/L (ref 135–145)

## 2021-11-09 LAB — CBC
HCT: 26.7 % — ABNORMAL LOW (ref 36.0–46.0)
Hemoglobin: 8.3 g/dL — ABNORMAL LOW (ref 12.0–15.0)
MCH: 26.9 pg (ref 26.0–34.0)
MCHC: 31.1 g/dL (ref 30.0–36.0)
MCV: 86.4 fL (ref 80.0–100.0)
Platelets: 527 10*3/uL — ABNORMAL HIGH (ref 150–400)
RBC: 3.09 MIL/uL — ABNORMAL LOW (ref 3.87–5.11)
RDW: 17 % — ABNORMAL HIGH (ref 11.5–15.5)
WBC: 22.5 10*3/uL — ABNORMAL HIGH (ref 4.0–10.5)
nRBC: 0 % (ref 0.0–0.2)

## 2021-11-09 MED ORDER — LORAZEPAM 2 MG/ML IJ SOLN
1.0000 mg | INTRAMUSCULAR | Status: DC | PRN
  Administered 2021-11-10 (×2): 1 mg via INTRAVENOUS
  Filled 2021-11-09 (×3): qty 1

## 2021-11-09 MED ORDER — LORAZEPAM 1 MG PO TABS: 1.0000 mg | ORAL_TABLET | ORAL | Status: DC | PRN

## 2021-11-09 MED ORDER — HYDROMORPHONE HCL 1 MG/ML IJ SOLN
1.0000 mg | INTRAMUSCULAR | Status: DC | PRN
  Administered 2021-11-10 (×9): 1 mg via INTRAVENOUS
  Filled 2021-11-09 (×9): qty 1

## 2021-11-09 MED ORDER — FLUCONAZOLE 100MG IVPB
100.0000 mg | INTRAVENOUS | Status: DC
  Administered 2021-11-09: 100 mg via INTRAVENOUS
  Filled 2021-11-09 (×2): qty 50

## 2021-11-09 MED ORDER — LORAZEPAM 2 MG/ML PO CONC
1.0000 mg | ORAL | Status: DC | PRN
  Administered 2021-11-10: 1 mg via SUBLINGUAL

## 2021-11-09 MED ORDER — GLYCOPYRROLATE 1 MG PO TABS: 1.0000 mg | ORAL_TABLET | ORAL | Status: DC | PRN

## 2021-11-09 MED ORDER — FLUCONAZOLE 50 MG PO TABS: 150.0000 mg | ORAL_TABLET | Freq: Every day | ORAL | Status: DC

## 2021-11-09 MED ORDER — GLYCOPYRROLATE 0.2 MG/ML IJ SOLN: 0.2000 mg | INTRAMUSCULAR | Status: DC | PRN

## 2021-11-09 NOTE — Progress Notes (Signed)
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 Extremely poor prognosis. High risk for further decline. Palliative care & hospice following and recs apprec  Acute blood loss anemia: etiology unclear. Holding eliquis,aspirin. S/p 1 unit of pRBCs transfused. H&H are labile. CT abd/pelvis shows interval progression of retroperitoneal & pelvic masses & adenopathy since prior CT, constipation & erosive changes of S1 (seen on prior CT). Continue on PPI    Adenocarcinoma of endometrium: w/ mets. No treatment options are available as per onco. On hospice care at home. Dilaudid, percocet prn. Family is considering comfort care only but has yet to make a decision. Would benefit from inpatient hospice. Palliative care & hospice are following. Extremely poor prognosis & high risk for further decline     HTN: holding home dose of metoprolol secondary to low normal BP   DM2: HbA1c 5.9, well controlled. Diet controlled   UTI: secondary to yeast. Started on fluconazole  Leukocytosis: infection vs secondary to metastatic endometrium cancer vs reactive. Procal 2.37. Blood cxs ordered   Thrombocytosis: etiology unclear. Will continue to monitor    DVT prophylaxis: SCDs Code Status: DNR Family Communication: discussed pt's care w/ pt's son at bedside and answered his questions  Disposition Plan: depends on what family decides   Level of care: Progressive  Status is: Inpatient Remains inpatient appropriate because: severity of illness    Consultants:   Palliative care hospice  Procedures:   Antimicrobials:   Subjective: Pt appears uncomfortable & in pain.   Objective: Vitals:   11/08/21 1935 11/08/21 2305 11/09/21 0353 11/09/21 0750  BP: 97/65 (!) 111/54 126/71 119/67  Pulse: (!) 113 98 (!) 103 (!) 110  Resp: '17 16 16 18  '$ Temp: 98.2 F (36.8 C) 98.1 F (36.7 C) 98.2 F (36.8 C) 99 F (37.2 C)  TempSrc: Axillary Axillary Axillary   SpO2: 98% 95% 100% 100%  Weight:   60.4 kg   Height:        Intake/Output Summary (Last 24 hours) at 11/09/2021 0820 Last data filed at 11/09/2021 6811 Gross per 24 hour  Intake 2149.75 ml  Output 150 ml  Net 1999.75 ml   Filed Weights   11/06/21 2010 11/09/21 0353  Weight: 60.7 kg 60.4 kg    Examination:  General exam: Appears uncomfortable  Respiratory system: diminished breath sounds b/l Cardiovascular system: S1 & S2+. No rubs or gallops Gastrointestinal system: Abd is soft, NT, ND & hypoactive bowel sounds Central nervous system: Moves all extremities Psychiatry: judgement and insight appears poor. Flat mood and affect     Data Reviewed: I have personally reviewed following labs and imaging studies  CBC: Recent Labs  Lab 11/06/21 2019 11/07/21 0410 11/08/21 0454 11/08/21 1938 11/09/21 0501  WBC 17.1* 20.2* 20.9*  --  22.5*  NEUTROABS 15.0*  --   --   --   --   HGB 6.6* 8.3* 7.9* 8.8* 8.3*  HCT 21.2* 26.7* 25.4* 28.4* 26.7*  MCV 83.1 86.1 84.7  --  86.4  PLT 560* 524* 526*  --  527*  Basic Metabolic Panel: Recent Labs  Lab 11/06/21 2019 11/07/21 0410 11/08/21 0454 11/09/21 0501  NA 135 136 139 141  K 3.8 3.9 3.6 3.7  CL 107 109 114* 112*  CO2 22 22 21* 21*  GLUCOSE 115* 89 95 113*  BUN '21 16 14 13  '$ CREATININE 0.90 0.76 0.62 0.61  CALCIUM 8.2* 7.9* 8.3* 8.6*   GFR: Estimated Creatinine Clearance: 48 mL/min (by C-G formula based on SCr of 0.61 mg/dL). Liver Function Tests: Recent Labs  Lab 11/06/21 2019 11/08/21 0454  AST 17 15  ALT 6 7  ALKPHOS  58 57  BILITOT 1.0 0.7  PROT 6.9 6.2*  ALBUMIN 2.3* 1.8*   Recent Labs  Lab 11/06/21 2019  LIPASE 22   No results for input(s): "AMMONIA" in the last 168 hours. Coagulation Profile: No results for input(s): "INR", "PROTIME" in the last 168 hours. Cardiac Enzymes: No results for input(s): "CKTOTAL", "CKMB", "CKMBINDEX", "TROPONINI" in the last 168 hours. BNP (last 3 results) No results for input(s): "PROBNP" in the last 8760 hours. HbA1C: No results for input(s): "HGBA1C" in the last 72 hours. CBG: Recent Labs  Lab 11/08/21 0525 11/08/21 1936 11/08/21 2312 11/09/21 0356 11/09/21 0751  GLUCAP 93 86 105* 101* 96   Lipid Profile: No results for input(s): "CHOL", "HDL", "LDLCALC", "TRIG", "CHOLHDL", "LDLDIRECT" in the last 72 hours. Thyroid Function Tests: No results for input(s): "TSH", "T4TOTAL", "FREET4", "T3FREE", "THYROIDAB" in the last 72 hours. Anemia Panel: No results for input(s): "VITAMINB12", "FOLATE", "FERRITIN", "TIBC", "IRON", "RETICCTPCT" in the last 72 hours. Sepsis Labs: Recent Labs  Lab 11/07/21 0409  PROCALCITON 2.37    Recent Results (from the past 240 hour(s))  Urine Culture     Status: Abnormal   Collection Time: 11/06/21  8:19 PM   Specimen: Urine, Random  Result Value Ref Range Status   Specimen Description   Final    URINE, RANDOM Performed at [facility], [address] [place], [place] [address]    Special Requests   Final    NONE Performed at [facility], [address] [place], [place] [address]    Culture 60,000 COLONIES/mL YEAST (A)  Final   Report Status 11/08/2021 FINAL  Final          Radiology Studies: No results found.      Scheduled Meds:  atorvastatin  40 mg Oral Daily   fluconazole  150 mg Oral Daily   midodrine  5 mg Oral TID AC   [START ON 11/10/2021] pantoprazole  40 mg Intravenous Q12H   Continuous Infusions:  sodium chloride Stopped (11/06/21 2259)   lactated ringers 75 mL/hr  at 11/09/21 0355   pantoprazole 8 mg/hr (11/09/21 9741)     LOS: 3 days    Time spent: 35 mins     Wyvonnia Dusky, MD [facility] Pager [phone]  If 7PM-7AM, please contact night-coverage [email or web address] 11/09/2021, 8:20 AM

## 2021-11-09 NOTE — Progress Notes (Addendum)
[facility] Room [address] 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 She was evaluated by [facility] nurse on 9.6.23 due to ongoing issues with pain. Later in the day patient again began experiencing worsening pain and family made decision to activate EMS and patient was brought to ED. She was admitted 9.6.23 with diagnosis of Anemia. Per Dr. Karie Georges with [facility] this is a related hospital admission.    Visited patient at bedside. Lab was present drawing blood cultures and son Brenda Bender and his significant other were present. Brenda Bender generally appears uncomfortable and even more so with lab draw. Per son/girlfriend they are hoping for family to get together late this afternoon to determine next steps which at this point they are not sure about. Left contact number should they need hospice presence.   Patient remains inpatient appropriate due to IV pain/medications   V/S: 99/75/17   139/76    spO2 99% room air I&O: 609/150 Labs: Chloride 112, CO2 21, Ca+ 8.6, WBC 22.5, RBC 3.09, Hgb 8.3, Hct 26.7 Diagnostics: None New IV/PRN: Protonix infusion @ 8 mg/hr continuous, LR continuous @ 75 ml/hr, Dilaudid 1 mg IV x 6 since 5pm 9.8, Ativan 1 mg IV x 1    Problem List: Failure to thrive:    Acute blood loss anemia   Adenocarcinoma of endometrium: w/ mets. No treatment options are available as per oncologist  D/C planning: family still undecided about next steps, continuing to treat the treatable, family meeting supposed to take place this evening Family: son Brenda Bender present at bedside IDT: Updated [place]: family agreeable to DNR, updated hospital team   Thank you, Brenda Bender, BSN, RN, [facility]ice hospital liaison [phone] or Epic chat

## 2021-11-10 DIAGNOSIS — R627 Adult failure to thrive: Secondary | ICD-10-CM

## 2021-11-10 DIAGNOSIS — C541 Malignant neoplasm of endometrium: Secondary | ICD-10-CM | POA: Diagnosis not present

## 2021-11-10 DIAGNOSIS — D62 Acute posthemorrhagic anemia: Secondary | ICD-10-CM | POA: Diagnosis not present

## 2021-11-10 NOTE — Progress Notes (Signed)
Report given to Hospice RN, Gwinda Passe.  AVS reviewed.  Gwinda Passe, RN verbalized understanding.  All questions/ concerns answered.  Hospice RN request PIVs to remain during transport.  AVS printed and placed in chart for EMS transporters.

## 2021-11-10 NOTE — TOC Transition Note (Addendum)
Transition of Care [facility]) - CM/SW Discharge Note   Patient Details  Name: Brenda Bender MRN: [id number] Date of Birth: 10-27-1940  Transition of Care [facility]) CM/SW Contact:  Izola Price, RN Phone Number: 11/10/2021, 5:30 PM   Clinical Narrative:  Patient discharging to Hospice Home via arrangements by M. Carlota Raspberry, hospice CM/liaison. EMS forms printed to unit and [facility] scheduled for transport at 7 pm or later per hospice liaison. Report number given by liaison. As of 456 pm Unit RN has been unable to reach anyone for report as seen in a progress note. RN CM Contacted M.Green via secure chat and was given another number to call and to let her know directly if that does not work and she will have facility call unit RN. 534 pm. Simmie Davies RN CM     Final next level of care: [place] Barriers to Discharge: Barriers Resolved   Patient Goals and CMS Choice        Discharge Placement                    Patient and family notified of of transfer: 11/10/21  Discharge Plan and Services                DME Arranged: N/A DME Agency: NA       HH Arranged: NA HH Agency: [facility]        Social Determinants of Health (SDOH) Interventions     Readmission Risk Interventions     No data to display

## 2021-11-10 NOTE — Discharge Summary (Signed)
Physician Discharge Summary  SAVANNHA WELLE WUJ:[id number] DOB: [birth year] DOA: 11/06/2021  [facility] [place] date: 11/06/2021 Discharge date: 11/10/2021  Admitted From: home  Disposition:  Inpatient hospice   Recommendations for Outpatient Follow-up:  Follow up with hospice provider ASAP   Home Health: no  Equipment/Devices:  Discharge Condition: comfort care  CODE STATUS: DNR/DNI  Diet recommendation: as tolerated  Brief/Interim Summary: HPI was taken from Dr. Francine Graven: Brenda Bender is a 81 y.o. female with medical history significant for hypertension, diabetes mellitus, endometrial cancer status post hysterectomy in 12/21 with recent diagnosis of recurrent metastatic disease in July, 2023 currently at home with hospice care.  Patient was brought into the ER by EMS for evaluation of worsening abdominal pain associated with poor oral intake and refusal of her medications.  Family was concerned she may be dehydrated and so brought her to the ER for evaluation. She received fentanyl 12.5 mcg by EMS. I am unable to do review of systems on this patient due to her underlying condition Labs reveal a drop in her H&H from 8.3 >> 6.6 and leukocytosis with a white count of 17,000. Family states that they have not seen any active bleeding such as vaginal bleed and states they have not seen any melena stools or hematemesis. Daughter wants her mother active issues to be addressed and wants her to get blood transfusion. Per ER provider stool was heme positive Patient was started on Protonix drip.   As per Dr. Jimmye Norman 9/7-9/10/23: Pt was presented w/ abd pain, poor oral intake and refusal to take her medications. Pt was found to have Hb 6.6 on admission. Pt did receive 1 unit of pRBCs. Of note, pt had a known hx of endometrium carcinoma w/ mets and for which pt did not want treatment for. CT abd/pelvis showed interval progression of retroperitoneal & pelvic masses & adenopathy  since prior CT, constipation & erosive changes of S1 (which was seen on prior CT). There no treatment options available for the endometrium carcinoma as per onco. Unfortunately, pt continued to decline. After multiple conservations w/ palliative care & hospice and the pt's family, the family decided to make her comfort care only. For more information, please see previous progress/consult notes.   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 Extremely poor prognosis. High risk for further decline. Palliative care & hospice following and recs apprec  Acute blood loss anemia: etiology unclear. Holding eliquis,aspirin. S/p 1 unit of pRBCs transfused. H&H are labile. CT abd/pelvis shows interval progression of retroperitoneal & pelvic masses & adenopathy since prior CT, constipation & erosive changes of S1 (seen on prior CT). Continue on PPI    Adenocarcinoma of endometrium: w/ mets. No treatment options are available as per onco. On hospice care at home. Dilaudid, percocet prn. Family agreed to comfort care only   HTN: holding home dose of metoprolol secondary to low normal BP   DM2: HbA1c 5.9, well controlled. Diet controlled   UTI: secondary to yeast. D/c fluconazole. Comfort care onely   Leukocytosis: infection vs secondary to metastatic endometrium cancer vs reactive. Procal 2.37. Blood cxs NGTD  Thrombocytosis: etiology unclear. Will continue to monitor  Discharge Instructions  Discharge Instructions     Diet general   Complete by: As directed    As tolerated   Discharge instructions   Complete by: As directed    F/u w/ hospice provider ASAP   Increase activity slowly   Complete  by: As directed    No wound care   Complete by: As directed       Allergies as of 11/10/2021       Reactions   Penicillins Nausea And Vomiting        Medication List     STOP taking these medications    acetaminophen 325 MG tablet Commonly known as: TYLENOL   amLODipine 5 MG tablet Commonly known as: NORVASC   apixaban 5 MG Tabs tablet Commonly known as: ELIQUIS   aspirin EC 81 MG tablet   atorvastatin 40 MG tablet Commonly known as: LIPITOR   cyanocobalamin 1000 MCG tablet Commonly known as: VITAMIN B12   FeroSul 325 (65 FE) MG tablet Generic drug: ferrous sulfate   fluconazole 150 MG tablet Commonly known as: DIFLUCAN   Januvia 25 MG tablet Generic drug: sitaGLIPtin   losartan 100 MG tablet Commonly known as: COZAAR   megestrol 40 MG tablet Commonly known as: MEGACE   metoprolol tartrate 50 MG tablet Commonly known as: LOPRESSOR       TAKE these medications    cetirizine 10 MG tablet Commonly known as: ZYRTEC Take 10 mg by mouth daily.   fluticasone 50 MCG/ACT nasal spray Commonly known as: FLONASE Place 1 spray into both nostrils daily.   gabapentin 100 MG capsule Commonly known as: NEURONTIN Take 100 mg by mouth 3 (three) times daily.   HYDROcodone-acetaminophen 10-325 MG tablet Commonly known as: Norco Take 1 tablet by mouth every 4 (four) hours as needed for moderate pain or severe pain.   LORazepam 0.5 MG tablet Commonly known as: ATIVAN Take 0.5 mg by mouth every 4 (four) hours as needed.   morphine 15 MG 12 hr tablet Commonly known as: MS CONTIN Take 1 tablet (15 mg total) by mouth every 12 (twelve) hours.   nystatin powder Commonly known as: MYCOSTATIN/NYSTOP Apply 1 Application topically 3 (three) times daily.   ondansetron 4 MG tablet Commonly known as: ZOFRAN Take 1 tablet (4 mg total) by mouth every 6 (six) hours as needed for nausea.   polyethylene glycol 17 g packet Commonly known as: MIRALAX / GLYCOLAX Take  17 g by mouth daily as needed for mild constipation.   senna 8.6 MG Tabs tablet Commonly known as: SENOKOT Take 1 tablet (8.6 mg total) by mouth daily as needed for mild constipation.        Allergies  Allergen Reactions   Penicillins Nausea And Vomiting    Consultations: Palliative care  Hospice    Procedures/Studies: CT ABDOMEN PELVIS WO CONTRAST  Result Date: 11/06/2021 CLINICAL DATA:  Abdominal pain. EXAM: CT ABDOMEN AND PELVIS WITHOUT CONTRAST TECHNIQUE: Multidetector CT imaging of the abdomen and pelvis was performed following the standard protocol without IV contrast. RADIATION DOSE REDUCTION: This exam was performed according to the departmental dose-optimization program which includes automated exposure control, adjustment of the mA and/or kV according to patient size and/or use of iterative reconstruction technique. COMPARISON:  CT abdomen pelvis dated 08/31/2021. FINDINGS: Evaluation of this exam is limited in the absence of intravenous contrast as well as due to respiratory motion. Lower chest: Left lung base subpleural atelectasis/scarring. The visualized lung bases are otherwise clear. There is coronary vascular calcification. There  is hypoattenuation of the cardiac blood pool suggestive of anemia. Clinical correlation is recommended. No intra-abdominal free air or free fluid. Hepatobiliary: The liver is unremarkable. No biliary ductal dilatation. The gallbladder is distended. No calcified gallstone. Pancreas: No active inflammatory changes. No dilatation of the main pancreatic duct. Spleen: Normal in size without focal abnormality. Adrenals/Urinary Tract: The adrenal glands are unremarkable. There is no hydronephrosis or nephrolithiasis on either side. The urinary bladder is partially distended. There is apparent diffuse thickening of the bladder wall which may be partly related to underdistention. Cystitis is not excluded. Correlation with urinalysis recommended. Stomach/Bowel:  There is large amount of stool throughout the colon. No bowel obstruction or active inflammation. Postsurgical changes of bowel with anastomotic sutures. Vascular/Lymphatic: Mild aortoiliac atherosclerotic disease. Large retroperitoneal masses/adenopathy significantly progressed since the prior CT. A 5.5 x 7.0 cm mass to the right of aorta (35/2) previously measured 3.8 x 3.8 cm. An 8.8 x 8.5 cm mass abutting the left iliacus muscle, previously measured 7.3 x 7.5 cm. A 5.5 x 2.5 cm mass posterior to the right iliac arteries previously measured 3.6 x 2.5 cm. Reproductive: Hysterectomy. Other: Diffuse subcutaneous edema. Musculoskeletal: Erosive changes of the S1 as seen on the prior CT. No acute osseous pathology. IMPRESSION: 1. Interval progression of retroperitoneal and pelvic masses and adenopathy since the prior CT. 2. Constipation. No bowel obstruction. 3. Erosive changes of the S1 as seen on the prior CT. 4. Aortic Atherosclerosis (ICD10-I70.0). Electronically Signed   By: Anner Crete M.D.   On: 11/06/2021 22:53   (Echo, Carotid, EGD, Colonoscopy, ERCP)    Subjective: Pt appears uncomfortable & in pain    Discharge Exam: Vitals:   11/09/21 1756 11/09/21 1934  BP: (!) 112/53 104/60  Pulse: (!) 103 94  Resp:  18  Temp:  (!) 97.5 F (36.4 C)  SpO2:  100%   Vitals:   11/09/21 1144 11/09/21 1622 11/09/21 1756 11/09/21 1934  BP: 139/76 90/68 (!) 112/53 104/60  Pulse: 75 (!) 105 (!) 103 94  Resp: '17 17  18  '$ Temp: 99 F (37.2 C) 99 F (37.2 C)  (!) 97.5 F (36.4 C)  TempSrc:      SpO2: 99% 100%  100%  Weight:      Height:        General: Pt is lethargic but appears in pain  Cardiovascular: S1/S2 +, no rubs, no gallops Respiratory: diminished breath sounds b/l  Abdominal: Soft, tenderness to palpation, ND, bowel sounds + Extremities: no edema, no cyanosis    The results of significant diagnostics from this hospitalization (including imaging, microbiology, ancillary and  laboratory) are listed below for reference.     Microbiology: Recent Results (from the past 240 hour(s))  Urine Culture     Status: Abnormal   Collection Time: 11/06/21  8:19 PM   Specimen: Urine, Random  Result Value Ref Range Status   Specimen Description   Final    URINE, RANDOM Performed at [facility], [address], [place], [place] [address]    Special Requests   Final    NONE Performed at [facility], [place], [place] [address]    Culture 60,000 COLONIES/mL YEAST (A)  Final   Report Status 11/08/2021 FINAL  Final  Culture, blood (Routine X 2) w Reflex to ID Panel     Status: None (Preliminary result)   Collection Time: 11/09/21 11:03 AM   Specimen: BLOOD RIGHT HAND  Result Value Ref Range Status  Specimen Description BLOOD RIGHT HAND  Final   Special Requests   Final    BOTTLES DRAWN AEROBIC AND ANAEROBIC Blood Culture adequate volume   Culture   Final    NO GROWTH < 24 HOURS Performed at [facility], [address] [place], [place] [address]    Report Status PENDING  Incomplete  Culture, blood (Routine X 2) w Reflex to ID Panel     Status: None (Preliminary result)   Collection Time: 11/09/21 11:06 AM   Specimen: BLOOD  Result Value Ref Range Status   Specimen Description BLOOD BLOOD RIGHT WRIST  Final   Special Requests   Final    BOTTLES DRAWN AEROBIC AND ANAEROBIC Blood Culture adequate volume   Culture   Final    NO GROWTH < 24 HOURS Performed at [facility], [address], [place], [place] [address]    Report Status PENDING  Incomplete     Labs: BNP (last 3 results) No results for input(s): "BNP" in the last 8760 hours. Basic Metabolic Panel: Recent Labs  Lab 11/06/21 2019 11/07/21 0410 11/08/21 0454 11/09/21 0501  NA 135 136 139 141  K 3.8 3.9 3.6 3.7  CL 107 109 114* 112*  CO2 22 22 21* 21*  GLUCOSE 115* 89 95 113*  BUN '21 16 14 13  '$ CREATININE 0.90 0.76 0.62 0.61  CALCIUM 8.2*  7.9* 8.3* 8.6*   Liver Function Tests: Recent Labs  Lab 11/06/21 2019 11/08/21 0454  AST 17 15  ALT 6 7  ALKPHOS 58 57  BILITOT 1.0 0.7  PROT 6.9 6.2*  ALBUMIN 2.3* 1.8*   Recent Labs  Lab 11/06/21 2019  LIPASE 22   No results for input(s): "AMMONIA" in the last 168 hours. CBC: Recent Labs  Lab 11/06/21 2019 11/07/21 0410 11/08/21 0454 11/08/21 1938 11/09/21 0501  WBC 17.1* 20.2* 20.9*  --  22.5*  NEUTROABS 15.0*  --   --   --   --   HGB 6.6* 8.3* 7.9* 8.8* 8.3*  HCT 21.2* 26.7* 25.4* 28.4* 26.7*  MCV 83.1 86.1 84.7  --  86.4  PLT 560* 524* 526*  --  527*   Cardiac Enzymes: No results for input(s): "CKTOTAL", "CKMB", "CKMBINDEX", "TROPONINI" in the last 168 hours. BNP: Invalid input(s): "POCBNP" CBG: Recent Labs  Lab 11/09/21 0751 11/09/21 1149 11/09/21 1624 11/09/21 1933 11/09/21 2306  GLUCAP 96 107* 129* 104* 98   D-Dimer No results for input(s): "DDIMER" in the last 72 hours. Hgb A1c No results for input(s): "HGBA1C" in the last 72 hours. Lipid Profile No results for input(s): "CHOL", "HDL", "LDLCALC", "TRIG", "CHOLHDL", "LDLDIRECT" in the last 72 hours. Thyroid function studies No results for input(s): "TSH", "T4TOTAL", "T3FREE", "THYROIDAB" in the last 72 hours.  Invalid input(s): "FREET3" Anemia work up No results for input(s): "VITAMINB12", "FOLATE", "FERRITIN", "TIBC", "IRON", "RETICCTPCT" in the last 72 hours. Urinalysis    Component Value Date/Time   COLORURINE YELLOW (A) 11/06/2021 2019   APPEARANCEUR CLOUDY (A) 11/06/2021 2019   LABSPEC 1.018 11/06/2021 2019   PHURINE 5.0 11/06/2021 2019   GLUCOSEU NEGATIVE 11/06/2021 2019   HGBUR NEGATIVE 11/06/2021 2019   BILIRUBINUR NEGATIVE 11/06/2021 2019   KETONESUR NEGATIVE 11/06/2021 2019   PROTEINUR NEGATIVE 11/06/2021 2019   NITRITE NEGATIVE 11/06/2021 2019   LEUKOCYTESUR LARGE (A) 11/06/2021 2019   Sepsis Labs Recent Labs  Lab 11/06/21 2019 11/07/21 0410 11/08/21 0454  11/09/21 0501  WBC 17.1* 20.2* 20.9* 22.5*   Microbiology Recent Results (  from the past 240 hour(s))  Urine Culture     Status: Abnormal   Collection Time: 11/06/21  8:19 PM   Specimen: Urine, Random  Result Value Ref Range Status   Specimen Description   Final    URINE, RANDOM Performed at [facility], [address], [place], [place] [address]    Special Requests   Final    NONE Performed at [facility], [place], [place] [address]    Culture 60,000 COLONIES/mL YEAST (A)  Final   Report Status 11/08/2021 FINAL  Final  Culture, blood (Routine X 2) w Reflex to ID Panel     Status: None (Preliminary result)   Collection Time: 11/09/21 11:03 AM   Specimen: BLOOD RIGHT HAND  Result Value Ref Range Status   Specimen Description BLOOD RIGHT HAND  Final   Special Requests   Final    BOTTLES DRAWN AEROBIC AND ANAEROBIC Blood Culture adequate volume   Culture   Final    NO GROWTH < 24 HOURS Performed at [facility], [address], [place], [place] [address]    Report Status PENDING  Incomplete  Culture, blood (Routine X 2) w Reflex to ID Panel     Status: None (Preliminary result)   Collection Time: 11/09/21 11:06 AM   Specimen: BLOOD  Result Value Ref Range Status   Specimen Description BLOOD BLOOD RIGHT WRIST  Final   Special Requests   Final    BOTTLES DRAWN AEROBIC AND ANAEROBIC Blood Culture adequate volume   Culture   Final    NO GROWTH < 24 HOURS Performed at [facility], [address], [place], [place] [address]    Report Status PENDING  Incomplete     Time coordinating discharge: Over 30 minutes  SIGNED:   Wyvonnia Dusky, MD  [facility] 11/10/2021, 1:10 PM Pager   If 7PM-7AM, please contact night-coverage [email or web address]

## 2021-11-10 NOTE — Progress Notes (Signed)
Attempted to call report per Case manager instructions to  [phone].  No answer. Left voicemail to call me back. Transport has been scheduled by case Freight forwarder for 7pm.

## 2021-11-14 LAB — CULTURE, BLOOD (ROUTINE X 2)
Culture: NO GROWTH
Culture: NO GROWTH
Special Requests: ADEQUATE
Special Requests: ADEQUATE

## 2021-12-01 DEATH — deceased

## 2021-12-16 ENCOUNTER — Other Ambulatory Visit: Payer: Medicare Other

## 2021-12-18 ENCOUNTER — Ambulatory Visit: Payer: Medicare Other

## 2021-12-27 ENCOUNTER — Telehealth: Payer: Self-pay | Admitting: *Deleted

## 2021-12-27 NOTE — Telephone Encounter (Signed)
Deceased [name] called requesting to speak with doctor regarding this patient cancer medicine. Please return her call

## 2021-12-27 NOTE — Telephone Encounter (Signed)
Spoke to pt's daughter and she is upset becasue she says that when patient was started on hospice, most of her medications were dc'd including her "cancer pill"which she took " 2 pill every day at 6p". According to [facility] these medications were discontinued by provider order and she is upset that pt started declinig after this medication change took effect. She believes that the "cancer pill" was helping her mom.   Looking at her chart, I don't see where pt was started on any oral chemo and this was informed to [facility]. Asked her to call back and let us know what medication she is referring to so we can have clarification.

## 2021-12-31 NOTE — Telephone Encounter (Signed)
I tried calling but did not reach her and was unable to leave a VM as the box was full.
